# Patient Record
Sex: Male | Born: 1950
Health system: Southern US, Community
[De-identification: ages and names within clinical notes are randomized; demographics above are authoritative.]

## PROBLEM LIST (undated history)

## (undated) DIAGNOSIS — F329 Major depressive disorder, single episode, unspecified: Secondary | ICD-10-CM

## (undated) DIAGNOSIS — I10 Essential (primary) hypertension: Secondary | ICD-10-CM

## (undated) DIAGNOSIS — K219 Gastro-esophageal reflux disease without esophagitis: Secondary | ICD-10-CM

## (undated) DIAGNOSIS — G4730 Sleep apnea, unspecified: Secondary | ICD-10-CM

## (undated) DIAGNOSIS — F32A Depression, unspecified: Secondary | ICD-10-CM

## (undated) DIAGNOSIS — R7303 Prediabetes: Secondary | ICD-10-CM

## (undated) DIAGNOSIS — F419 Anxiety disorder, unspecified: Secondary | ICD-10-CM

## (undated) HISTORY — PX: VASECTOMY: SHX75

## (undated) HISTORY — PX: TONSILLECTOMY: SUR1361

## (undated) HISTORY — PX: HERNIA REPAIR: SHX51

---

## 2004-04-26 ENCOUNTER — Ambulatory Visit: Payer: Self-pay | Admitting: Internal Medicine

## 2005-10-01 ENCOUNTER — Ambulatory Visit: Payer: Self-pay | Admitting: Unknown Physician Specialty

## 2005-11-17 ENCOUNTER — Ambulatory Visit: Payer: Self-pay | Admitting: Unknown Physician Specialty

## 2005-11-17 LAB — HM COLONOSCOPY

## 2006-09-23 ENCOUNTER — Ambulatory Visit: Payer: Self-pay | Admitting: Unknown Physician Specialty

## 2006-10-05 ENCOUNTER — Ambulatory Visit: Payer: Self-pay | Admitting: Unknown Physician Specialty

## 2006-10-05 HISTORY — PX: UPPER GI ENDOSCOPY: SHX6162

## 2008-07-20 ENCOUNTER — Ambulatory Visit: Payer: Self-pay | Admitting: Family Medicine

## 2010-05-27 ENCOUNTER — Ambulatory Visit: Payer: Self-pay | Admitting: Surgery

## 2011-12-23 ENCOUNTER — Ambulatory Visit: Payer: Self-pay | Admitting: Family Medicine

## 2012-01-27 ENCOUNTER — Ambulatory Visit: Payer: Self-pay | Admitting: Family Medicine

## 2014-06-21 LAB — TSH: TSH: 3.98 u[IU]/mL (ref 0.41–5.90)

## 2014-06-21 LAB — CBC AND DIFFERENTIAL
HCT: 40 % — AB (ref 41–53)
HEMOGLOBIN: 13.9 g/dL (ref 13.5–17.5)
Neutrophils Absolute: 3 /uL
Platelets: 149 10*3/uL — AB (ref 150–399)
WBC: 4.8 10*3/mL

## 2014-06-21 LAB — HEPATIC FUNCTION PANEL
ALK PHOS: 90 U/L (ref 25–125)
ALT: 10 U/L (ref 10–40)
AST: 16 U/L (ref 14–40)
BILIRUBIN, TOTAL: 0.4 mg/dL

## 2014-06-21 LAB — BASIC METABOLIC PANEL
BUN: 15 mg/dL (ref 4–21)
CREATININE: 1.1 mg/dL (ref 0.6–1.3)
Glucose: 107 mg/dL
POTASSIUM: 5 mmol/L (ref 3.4–5.3)
Sodium: 138 mmol/L (ref 137–147)

## 2014-06-21 LAB — LIPID PANEL
Cholesterol: 215 mg/dL — AB (ref 0–200)
HDL: 40 mg/dL (ref 35–70)
LDL Cholesterol: 156 mg/dL
LDl/HDL Ratio: 3.9
TRIGLYCERIDES: 96 mg/dL (ref 40–160)

## 2014-06-21 LAB — PSA: PSA: 1

## 2015-06-14 DIAGNOSIS — E7800 Pure hypercholesterolemia, unspecified: Secondary | ICD-10-CM | POA: Insufficient documentation

## 2015-06-14 DIAGNOSIS — F411 Generalized anxiety disorder: Secondary | ICD-10-CM | POA: Insufficient documentation

## 2015-06-14 DIAGNOSIS — F32A Depression, unspecified: Secondary | ICD-10-CM | POA: Insufficient documentation

## 2015-06-14 DIAGNOSIS — C4491 Basal cell carcinoma of skin, unspecified: Secondary | ICD-10-CM | POA: Insufficient documentation

## 2015-06-14 DIAGNOSIS — F329 Major depressive disorder, single episode, unspecified: Secondary | ICD-10-CM | POA: Insufficient documentation

## 2015-06-14 DIAGNOSIS — M1990 Unspecified osteoarthritis, unspecified site: Secondary | ICD-10-CM | POA: Insufficient documentation

## 2015-06-14 DIAGNOSIS — J309 Allergic rhinitis, unspecified: Secondary | ICD-10-CM | POA: Insufficient documentation

## 2015-06-14 DIAGNOSIS — G4700 Insomnia, unspecified: Secondary | ICD-10-CM | POA: Insufficient documentation

## 2015-06-14 DIAGNOSIS — K5790 Diverticulosis of intestine, part unspecified, without perforation or abscess without bleeding: Secondary | ICD-10-CM | POA: Insufficient documentation

## 2015-06-14 DIAGNOSIS — K219 Gastro-esophageal reflux disease without esophagitis: Secondary | ICD-10-CM | POA: Insufficient documentation

## 2015-06-14 DIAGNOSIS — E559 Vitamin D deficiency, unspecified: Secondary | ICD-10-CM | POA: Insufficient documentation

## 2015-06-20 ENCOUNTER — Ambulatory Visit (INDEPENDENT_AMBULATORY_CARE_PROVIDER_SITE_OTHER): Payer: No Typology Code available for payment source | Admitting: Family Medicine

## 2015-06-20 ENCOUNTER — Encounter: Payer: Self-pay | Admitting: Family Medicine

## 2015-06-20 VITALS — BP 110/64 | HR 64 | Temp 97.8°F | Resp 16 | Ht 73.0 in | Wt 216.0 lb

## 2015-06-20 DIAGNOSIS — Z125 Encounter for screening for malignant neoplasm of prostate: Secondary | ICD-10-CM

## 2015-06-20 DIAGNOSIS — N411 Chronic prostatitis: Secondary | ICD-10-CM | POA: Diagnosis not present

## 2015-06-20 DIAGNOSIS — Z0000 Encounter for general adult medical examination without abnormal findings: Secondary | ICD-10-CM | POA: Diagnosis not present

## 2015-06-20 DIAGNOSIS — Z1211 Encounter for screening for malignant neoplasm of colon: Secondary | ICD-10-CM

## 2015-06-20 DIAGNOSIS — Z23 Encounter for immunization: Secondary | ICD-10-CM

## 2015-06-20 LAB — POCT URINALYSIS DIPSTICK
Bilirubin, UA: NEGATIVE
Blood, UA: NEGATIVE
GLUCOSE UA: NEGATIVE
KETONES UA: NEGATIVE
LEUKOCYTES UA: NEGATIVE
Nitrite, UA: NEGATIVE
PROTEIN UA: NEGATIVE
SPEC GRAV UA: 1.015
Urobilinogen, UA: 0.2
pH, UA: 6

## 2015-06-20 LAB — IFOBT (OCCULT BLOOD): IMMUNOLOGICAL FECAL OCCULT BLOOD TEST: NEGATIVE

## 2015-06-20 MED ORDER — SERTRALINE HCL 50 MG PO TABS: 50.0000 mg | ORAL_TABLET | Freq: Every day | ORAL | Status: DC

## 2015-06-20 MED ORDER — DOXYCYCLINE HYCLATE 100 MG PO TABS: 100.0000 mg | ORAL_TABLET | Freq: Two times a day (BID) | ORAL | Status: DC

## 2015-06-20 NOTE — Progress Notes (Signed)
Patient ID: [name], male   DOB: [birth year], 64 y.o.   MRN: [id number]       Patient: Steven Russell, Male    DOB: Oct 15, 1950, 64 y.o.   MRN: [id number] Visit Date: 06/20/2015  Today's Provider: Wilhemena Durie, MD   Chief Complaint  Patient presents with  . Annual Exam   Subjective:    Annual physical exam Steven Russell is a 64 y.o. male who presents today for health maintenance and complete physical. He feels well. He reports he is not exercising other than work. He reports he is sleeping well. Patient retired from work 2 years ago. He has a daughter,, sotalol, and 3 grandchildren that live in [place]. Overall he feels well. 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 ----------------------------------------------------------------- Colonoscopy- 11/17/05 internal hemorrhoids Tdap- 03/13/14 EKG- 06/21/14 Zoster- 12/07/12  Review of Systems  Constitutional: Negative.   HENT: Negative.   Eyes: Negative.   Respiratory: Negative.   Cardiovascular: Negative.   Gastrointestinal: Negative.   Endocrine: Negative.   Genitourinary: Positive for discharge.       For a few months patient has noticed that he has some mild chronic penile discharge of he and his wife have not been safely active for more than a week. It is thick and white color discharge. No other urinary symptoms. No risk factors for STD.  Musculoskeletal: Negative.   Skin: Negative.   Allergic/Immunologic: Negative.   Neurological: Negative.   Hematological: Negative.   Psychiatric/Behavioral: Negative.     Social History      He  reports that he has quit smoking. He does not have any smokeless tobacco history on file. He reports that he drinks alcohol. He reports that he does not use illicit drugs.       Social History   Social History  . Marital Status: Married    Spouse Name: N/A  . Number of Children: N/A  . Years of Education: N/A   Social History Main Topics  . Smoking status:  Former Research scientist (life sciences)  . Smokeless tobacco: None     Comment: quit 35 years ago  . Alcohol Use: Yes     Comment: occasionally, wine  . Drug Use: No  . Sexual Activity: Not Asked   Other Topics Concern  . None   Social History Narrative    Patient Active Problem List   Diagnosis Date Noted  . Allergic rhinitis 06/14/2015  . Basal cell carcinoma of skin 06/14/2015  . Clinical depression 06/14/2015  . DD (diverticular disease) 06/14/2015  . Anxiety, generalized 06/14/2015  . Acid reflux 06/14/2015  . Hypercholesteremia 06/14/2015  . Cannot sleep 06/14/2015  . Arthritis, degenerative 06/14/2015  . Avitaminosis D 06/14/2015    Past Surgical History  Procedure Laterality Date  . Hernia repair    . Vasectomy    . Tonsillectomy    . Upper gi endoscopy  10/05/06    normal duodenum, reflux esophagitis and hiatus hernia    Family History        Family Status  Relation Status Death Age  . Mother Deceased 78  . Father Deceased 43  . Sister Alive   . Brother Alive   . Daughter Alive   . Daughter Alive   . Sister Alive         His family history includes Brain cancer in his paternal grandfather; Breast cancer in his mother; Diabetes in his daughter and sister; Glaucoma in his mother; Lung cancer in  his father; Melanoma in his sister.    No Known Allergies  Previous Medications   ASPIRIN 81 MG TABLET    Take by mouth.   ESOMEPRAZOLE (NEXIUM) 40 MG CAPSULE    Take by mouth.   SERTRALINE (ZOLOFT) 50 MG TABLET    Take by mouth.    Patient Care Team: Jerrol Banana., MD as PCP - General (Family Medicine)     Objective:   Vitals: BP 110/64 mmHg  Pulse 64  Temp(Src) 97.8 F (36.6 C) (Oral)  Resp 16  Ht 6\' 1"  (1.854 m)  Wt 216 lb (97.977 kg)  BMI 28.50 kg/m2   Physical Exam  Constitutional: He is oriented to person, place, and time. He appears well-developed and well-nourished.  HENT:  Head: Normocephalic and atraumatic.  Right Ear: External ear normal.  Left  Ear: External ear normal.  Nose: Nose normal.  Mouth/Throat: Oropharynx is clear and moist.  Eyes: Conjunctivae and EOM are normal. Pupils are equal, round, and reactive to light.  Neck: Normal range of motion. Neck supple.  Cardiovascular: Normal rate, regular rhythm, normal heart sounds and intact distal pulses.   Pulmonary/Chest: Effort normal and breath sounds normal.  Abdominal: Soft. Bowel sounds are normal.  Genitourinary: Rectum normal, prostate normal and penis normal.  Musculoskeletal: Normal range of motion.  Neurological: He is alert and oriented to person, place, and time. He has normal reflexes.  Skin: Skin is warm and dry.  Psychiatric: He has a normal mood and affect. His behavior is normal. Judgment and thought content normal.     Depression Screen PHQ 2/9 Scores 06/20/2015  PHQ - 2 Score 0      Assessment & Plan:     Routine Health Maintenance and Physical Exam  Exercise Activities and Dietary recommendations Goals    None      Immunization History  Administered Date(s) Administered  . Td 03/25/2004  . Tdap 03/13/2014  . Zoster 12/07/2012    Health Maintenance  Topic Date Due  . Hepatitis C Screening  [birth year]  . HIV Screening  06/26/1967  . INFLUENZA VACCINE  02/12/2015  . COLONOSCOPY  11/18/2015  . TETANUS/TDAP  03/13/2024  . ZOSTAVAX  Completed      Discussed health benefits of physical activity, and encouraged him to engage in regular exercise appropriate for his age and condition.   I have done the exam and reviewed the above chart and it is accurate to the best of my knowledge. Chronic prostatitis We'll treat with doxycycline 100 mg twice a day for 2 weeks. May need further evaluation if it does not clear clinically. I have done the exam and reviewed the above chart and it is accurate to the best of my knowledge.  --------------------------------------------------------------------

## 2015-06-21 LAB — PSA: Prostate Specific Ag, Serum: 2 ng/mL (ref 0.0–4.0)

## 2015-06-21 LAB — COMPREHENSIVE METABOLIC PANEL
ALT: 15 IU/L (ref 0–44)
AST: 19 IU/L (ref 0–40)
Albumin/Globulin Ratio: 1.7 (ref 1.1–2.5)
Albumin: 4.5 g/dL (ref 3.6–4.8)
Alkaline Phosphatase: 99 IU/L (ref 39–117)
BUN/Creatinine Ratio: 15 (ref 10–22)
BUN: 16 mg/dL (ref 8–27)
Bilirubin Total: 0.5 mg/dL (ref 0.0–1.2)
CALCIUM: 9.5 mg/dL (ref 8.6–10.2)
CO2: 27 mmol/L (ref 18–29)
CREATININE: 1.09 mg/dL (ref 0.76–1.27)
Chloride: 98 mmol/L (ref 97–106)
GFR calc Af Amer: 84 mL/min/{1.73_m2} (ref >59)
GFR, EST NON AFRICAN AMERICAN: 72 mL/min/{1.73_m2} (ref >59)
GLOBULIN, TOTAL: 2.6 g/dL (ref 1.5–4.5)
Glucose: 113 mg/dL — ABNORMAL HIGH (ref 65–99)
Potassium: 4.4 mmol/L (ref 3.5–5.2)
SODIUM: 142 mmol/L (ref 136–144)
Total Protein: 7.1 g/dL (ref 6.0–8.5)

## 2015-06-21 LAB — CBC WITH DIFFERENTIAL/PLATELET
Basophils Absolute: 0 10*3/uL (ref 0.0–0.2)
Basos: 0 %
EOS (ABSOLUTE): 0 10*3/uL (ref 0.0–0.4)
EOS: 1 %
HEMATOCRIT: 41.7 % (ref 37.5–51.0)
HEMOGLOBIN: 14.1 g/dL (ref 12.6–17.7)
IMMATURE GRANS (ABS): 0 10*3/uL (ref 0.0–0.1)
IMMATURE GRANULOCYTES: 0 %
Lymphocytes Absolute: 1 10*3/uL (ref 0.7–3.1)
Lymphs: 21 %
MCH: 29 pg (ref 26.6–33.0)
MCHC: 33.8 g/dL (ref 31.5–35.7)
MCV: 86 fL (ref 79–97)
MONOCYTES: 11 %
Monocytes Absolute: 0.5 10*3/uL (ref 0.1–0.9)
NEUTROS PCT: 67 %
Neutrophils Absolute: 3.1 10*3/uL (ref 1.4–7.0)
PLATELETS: 159 10*3/uL (ref 150–379)
RBC: 4.87 x10E6/uL (ref 4.14–5.80)
RDW: 13.6 % (ref 12.3–15.4)
WBC: 4.6 10*3/uL (ref 3.4–10.8)

## 2015-06-21 LAB — TSH: TSH: 2.26 u[IU]/mL (ref 0.450–4.500)

## 2015-06-21 LAB — LIPID PANEL WITH LDL/HDL RATIO
Cholesterol, Total: 197 mg/dL (ref 100–199)
HDL: 37 mg/dL — AB (ref >39)
LDL Calculated: 136 mg/dL — ABNORMAL HIGH (ref 0–99)
LDl/HDL Ratio: 3.7 ratio units — ABNORMAL HIGH (ref 0.0–3.6)
Triglycerides: 120 mg/dL (ref 0–149)
VLDL Cholesterol Cal: 24 mg/dL (ref 5–40)

## 2015-06-22 ENCOUNTER — Other Ambulatory Visit: Payer: Self-pay | Admitting: Family Medicine

## 2015-07-11 ENCOUNTER — Encounter: Payer: Self-pay | Admitting: *Deleted

## 2015-07-12 ENCOUNTER — Ambulatory Visit: Payer: No Typology Code available for payment source | Admitting: Anesthesiology

## 2015-07-12 ENCOUNTER — Encounter: Payer: Self-pay | Admitting: *Deleted

## 2015-07-12 ENCOUNTER — Ambulatory Visit
Admission: RE | Admit: 2015-07-12 | Discharge: 2015-07-12 | Disposition: A | Discharge status: Home / Self Care | Payer: No Typology Code available for payment source | Source: Ambulatory Visit | Attending: Unknown Physician Specialty | Admitting: Unknown Physician Specialty

## 2015-07-12 ENCOUNTER — Encounter
Admission: RE | Disposition: A | Discharge status: Home / Self Care | Payer: Self-pay | Source: Ambulatory Visit | Attending: Unknown Physician Specialty

## 2015-07-12 DIAGNOSIS — K640 First degree hemorrhoids: Secondary | ICD-10-CM | POA: Insufficient documentation

## 2015-07-12 DIAGNOSIS — Z1211 Encounter for screening for malignant neoplasm of colon: Secondary | ICD-10-CM | POA: Diagnosis not present

## 2015-07-12 DIAGNOSIS — Z79899 Other long term (current) drug therapy: Secondary | ICD-10-CM | POA: Diagnosis not present

## 2015-07-12 DIAGNOSIS — Z87891 Personal history of nicotine dependence: Secondary | ICD-10-CM | POA: Insufficient documentation

## 2015-07-12 DIAGNOSIS — K5730 Diverticulosis of large intestine without perforation or abscess without bleeding: Secondary | ICD-10-CM | POA: Diagnosis not present

## 2015-07-12 DIAGNOSIS — Z7982 Long term (current) use of aspirin: Secondary | ICD-10-CM | POA: Insufficient documentation

## 2015-07-12 DIAGNOSIS — F329 Major depressive disorder, single episode, unspecified: Secondary | ICD-10-CM | POA: Insufficient documentation

## 2015-07-12 DIAGNOSIS — K219 Gastro-esophageal reflux disease without esophagitis: Secondary | ICD-10-CM | POA: Diagnosis not present

## 2015-07-12 HISTORY — DX: Major depressive disorder, single episode, unspecified: F32.9

## 2015-07-12 HISTORY — DX: Gastro-esophageal reflux disease without esophagitis: K21.9

## 2015-07-12 HISTORY — PX: COLONOSCOPY WITH PROPOFOL: SHX5780

## 2015-07-12 HISTORY — DX: Depression, unspecified: F32.A

## 2015-07-12 SURGERY — COLONOSCOPY WITH PROPOFOL
Anesthesia: General

## 2015-07-12 MED ORDER — FENTANYL CITRATE (PF) 100 MCG/2ML IJ SOLN
INTRAMUSCULAR | Status: DC | PRN
  Administered 2015-07-12: 50 ug via INTRAVENOUS

## 2015-07-12 MED ORDER — MIDAZOLAM HCL 2 MG/2ML IJ SOLN
INTRAMUSCULAR | Status: DC | PRN
  Administered 2015-07-12: 1 mg via INTRAVENOUS

## 2015-07-12 MED ORDER — EPHEDRINE SULFATE 50 MG/ML IJ SOLN
INTRAMUSCULAR | Status: DC | PRN
  Administered 2015-07-12: 5 mg via INTRAVENOUS

## 2015-07-12 MED ORDER — SODIUM CHLORIDE 0.9 % IV SOLN: INTRAVENOUS | Status: DC

## 2015-07-12 MED ORDER — SODIUM CHLORIDE 0.9 % IV SOLN
INTRAVENOUS | Status: DC
  Administered 2015-07-12: 1000 mL via INTRAVENOUS

## 2015-07-12 MED ORDER — PROPOFOL 500 MG/50ML IV EMUL
INTRAVENOUS | Status: DC | PRN
  Administered 2015-07-12: 120 ug/kg/min via INTRAVENOUS

## 2015-07-12 NOTE — Transfer of Care (Signed)
Immediate Anesthesia Transfer of Care Note  Patient: Steven Russell  Procedure(s) Performed: Procedure(s): COLONOSCOPY WITH PROPOFOL (N/A)  Patient Location: PACU  Anesthesia Type:General  Level of Consciousness: awake, alert  and sedated  Airway & Oxygen Therapy: Patient Spontanous Breathing and Patient connected to nasal cannula oxygen  Post-op Assessment: Report given to RN and Post -op Vital signs reviewed and stable  Post vital signs: Reviewed and stable  Last Vitals:  Filed Vitals:   07/12/15 1442  Pulse: 59  Temp: 36.2 C  Resp: 20    Complications: No apparent anesthesia complications

## 2015-07-12 NOTE — Op Note (Signed)
[facility] Gastroenterology Patient Name: Steven Russell Procedure Date: 07/12/2015 3:04 PM MRN: [id number] Account #: [id number] Date of Birth: Mar 29, 1951 Admit Type: Outpatient Age: 64 Room: [facility] ENDO ROOM 1 Gender: Male Note Status: Finalized Procedure:         Colonoscopy Indications:       Screening for colorectal malignant neoplasm Providers:         Manya Silvas, MD Referring MD:      Janine Ores. Rosanna Randy, MD (Referring MD) Medicines:         Propofol per Anesthesia Complications:     No immediate complications. Procedure:         Pre-Anesthesia Assessment:                    - After reviewing the risks and benefits, the patient was                     deemed in satisfactory condition to undergo the procedure.                    After obtaining informed consent, the colonoscope was                     passed under direct vision. Throughout the procedure, the                     patient's blood pressure, pulse, and oxygen saturations                     were monitored continuously. The Colonoscope was                     introduced through the anus and advanced to the the cecum,                     identified by appendiceal orifice and ileocecal valve. The                     colonoscopy was performed without difficulty. The patient                     tolerated the procedure well. The quality of the bowel                     preparation was good. Findings:      Multiple medium-mouthed diverticula were found in the sigmoid colon, in       the descending colon and in the transverse colon.      Internal hemorrhoids were found during endoscopy. The hemorrhoids were       small and Grade I (internal hemorrhoids that do not prolapse).      The exam was otherwise without abnormality. Impression:        - Diverticulosis in the sigmoid colon, in the descending                     colon and in the transverse colon.                    - Internal hemorrhoids.                  - The examination was otherwise normal.                    - No specimens collected.  Recommendation:    - The findings and recommendations were discussed with the                     patient's family. Manya Silvas, MD 07/12/2015 3:35:56 PM This report has been signed electronically. Number of Addenda: 0 Note Initiated On: 07/12/2015 3:04 PM Scope Withdrawal Time: 0 hours 10 minutes 52 seconds  Total Procedure Duration: 0 hours 16 minutes 49 seconds       [facility]

## 2015-07-12 NOTE — Anesthesia Procedure Notes (Signed)
Performed by: COOK-MARTIN, Sumi Lye Pre-anesthesia Checklist: Patient identified, Emergency Drugs available, Suction available, Patient being monitored and Timeout performed Patient Re-evaluated:Patient Re-evaluated prior to inductionOxygen Delivery Method: Nasal cannula Preoxygenation: Pre-oxygenation with 100% oxygen Intubation Type: IV induction Placement Confirmation: positive ETCO2 and CO2 detector       

## 2015-07-12 NOTE — Anesthesia Preprocedure Evaluation (Signed)
Anesthesia Evaluation  Patient identified by MRN, date of birth, ID band Patient awake    Reviewed: Allergy & Precautions, H&P , NPO status , Patient's Chart, lab work & pertinent test results  History of Anesthesia Complications Negative for: history of anesthetic complications  Airway Mallampati: III  TM Distance: >3 FB Neck ROM: limited    Dental no notable dental hx. (+) Teeth Intact   Pulmonary sleep apnea and Continuous Positive Airway Pressure Ventilation , former smoker,    Pulmonary exam normal breath sounds clear to auscultation       Cardiovascular Exercise Tolerance: Good (-) angina(-) Past MI and (-) DOE negative cardio ROS Normal cardiovascular exam Rhythm:regular Rate:Normal     Neuro/Psych PSYCHIATRIC DISORDERS Depression negative neurological ROS     GI/Hepatic Neg liver ROS, GERD  Controlled,  Endo/Other  negative endocrine ROS  Renal/GU negative Renal ROS  negative genitourinary   Musculoskeletal   Abdominal   Peds  Hematology negative hematology ROS (+)   Anesthesia Other Findings Past Medical History:   GERD (gastroesophageal reflux disease)                       Depression                                                  Past Surgical History:   HERNIA REPAIR                                                 VASECTOMY                                                     TONSILLECTOMY                                                 UPPER GI ENDOSCOPY                               10/05/06       Comment:normal duodenum, reflux esophagitis and hiatus               hernia  BMI    Body Mass Index   28.37 kg/m 2      Reproductive/Obstetrics negative OB ROS                             Anesthesia Physical Anesthesia Plan  ASA: III  Anesthesia Plan: General   Post-op Pain Management:    Induction:   Airway Management Planned:   Additional Equipment:    Intra-op Plan:   Post-operative Plan:   Informed Consent: I have reviewed the patients History and Physical, chart, labs and discussed the procedure including the risks, benefits and alternatives for the proposed anesthesia with the patient or authorized representative who has indicated his/her understanding  and acceptance.   Dental Advisory Given  Plan Discussed with: Anesthesiologist, CRNA and Surgeon  Anesthesia Plan Comments:         Anesthesia Quick Evaluation

## 2015-07-12 NOTE — H&P (Signed)
   Primary Care Physician:  Wilhemena Durie, MD Primary Gastroenterologist:  Dr. Vira Agar  Pre-Procedure History & Physical: HPI:  Steven Russell is a 64 y.o. male is here for an colonoscopy.   Past Medical History  Diagnosis Date  . GERD (gastroesophageal reflux disease)   . Depression     Past Surgical History  Procedure Laterality Date  . Hernia repair    . Vasectomy    . Tonsillectomy    . Upper gi endoscopy  10/05/06    normal duodenum, reflux esophagitis and hiatus hernia    Prior to Admission medications   Medication Sig Start Date End Date Taking? Authorizing Provider  aspirin 81 MG tablet Take by mouth. 05/26/11   Historical Provider, MD  doxycycline (VIBRA-TABS) 100 MG tablet Take 1 tablet (100 mg total) by mouth 2 (two) times daily. 06/20/15   Richard Maceo Pro., MD  esomeprazole (NEXIUM) 40 MG capsule Take by mouth. 06/15/13   Historical Provider, MD  sertraline (ZOLOFT) 50 MG tablet TAKE 1 1/2 TABLETS BY MOUTH EVERY DAY 06/22/15   Jerrol Banana., MD    Allergies as of 07/10/2015  . (No Known Allergies)    Family History  Problem Relation Age of Onset  . Breast cancer Mother   . Glaucoma Mother   . Lung cancer Father   . Diabetes Sister   . Diabetes Daughter   . Brain cancer Paternal Grandfather   . Melanoma Sister     Social History   Social History  . Marital Status: Married    Spouse Name: N/A  . Number of Children: N/A  . Years of Education: N/A   Occupational History  . Not on file.   Social History Main Topics  . Smoking status: Former Research scientist (life sciences)  . Smokeless tobacco: Not on file     Comment: quit 35 years ago  . Alcohol Use: Yes     Comment: occasionally, wine  . Drug Use: No  . Sexual Activity: Not on file   Other Topics Concern  . Not on file   Social History Narrative    Review of Systems: See HPI, otherwise negative ROS  Physical Exam: BP 133/79 mmHg  Pulse 51  Temp(Src) 97 F (36.1 C) (Tympanic)  Resp 17  Ht  6\' 1"  (1.854 m)  Wt 97.523 kg (215 lb)  BMI 28.37 kg/m2  SpO2 97% General:   Alert,  pleasant and cooperative in NAD Head:  Normocephalic and atraumatic. Neck:  Supple; no masses or thyromegaly. Lungs:  Clear throughout to auscultation.    Heart:  Regular rate and rhythm. Abdomen:  Soft, nontender and nondistended. Normal bowel sounds, without guarding, and without rebound.   Neurologic:  Alert and  oriented x4;  grossly normal neurologically.  Impression/Plan: [name] is here for an colonoscopy to be performed for screening  Risks, benefits, limitations, and alternatives regarding  colonoscopy have been reviewed with the patient.  Questions have been answered.  All parties agreeable.   Gaylyn Cheers, MD  07/12/2015, 6:55 PM

## 2015-07-12 NOTE — Anesthesia Postprocedure Evaluation (Signed)
Anesthesia Post Note  Patient: Steven Russell  Procedure(s) Performed: Procedure(s) (LRB): COLONOSCOPY WITH PROPOFOL (N/A)  Patient location during evaluation: Endoscopy Anesthesia Type: General Level of consciousness: awake Pain management: pain level controlled Vital Signs Assessment: post-procedure vital signs reviewed and stable Respiratory status: spontaneous breathing Cardiovascular status: blood pressure returned to baseline Anesthetic complications: no    Last Vitals:  Filed Vitals:   07/12/15 1559 07/12/15 1609  BP: 114/78 133/79  Pulse: 51 51  Temp:    Resp: 13 17    Last Pain: There were no vitals filed for this visit.               Heidie Krall S

## 2015-07-17 ENCOUNTER — Encounter: Payer: Self-pay | Admitting: Unknown Physician Specialty

## 2015-11-01 ENCOUNTER — Encounter: Payer: Self-pay | Admitting: Family Medicine

## 2015-11-01 ENCOUNTER — Ambulatory Visit (INDEPENDENT_AMBULATORY_CARE_PROVIDER_SITE_OTHER): Payer: Self-pay | Admitting: Family Medicine

## 2015-11-01 VITALS — BP 128/78 | HR 68 | Temp 98.9°F | Resp 14 | Wt 220.0 lb

## 2015-11-01 DIAGNOSIS — R202 Paresthesia of skin: Secondary | ICD-10-CM

## 2015-11-01 NOTE — Patient Instructions (Addendum)
Discussed taking two Aleve twice daily with food and f/u with your chiropractor. If getting worse, follow up with Dr. Rosanna Randy for further evaluation.

## 2015-11-01 NOTE — Progress Notes (Signed)
Subjective:     Patient ID: Steven Russell, male   DOB: [birth year], 65 y.o.   MRN: [id number]  HPI  Chief Complaint  Patient presents with  . Numbness    Patient reports left leg and foot numbness. Numbness started yesterday. Patient states he also has a throbbing sensation in upper thigh area.   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 Reports chronic low back pain and get periodic adjustments from his chiropractor, Dr. Olen Cordial. States his back was adjusted this AM. No recent back imaging reported. Does to wear a tool belt while working.   Review of Systems     Objective:   Physical Exam  Constitutional: He appears well-developed and well-nourished. No distress.  Cardiovascular:  Pulses:      Dorsalis pedis pulses are 2+ on the left side.       Posterior tibial pulses are 2+ on the left side.  Skin warm without pedal edema  Musculoskeletal:  Muscle strength in lower extremities 5/5. SLR's to 90 degrees without radiation of back pain.  Neurological:  Sensation to PP intact in distal left lower extremity  Skin:  No rash noted       Assessment:    1. Left leg paresthesias     Plan:    Discussed use of nsaid;s and f/u with his chiropractor. Will f/u with his primary M.D., Dr. Rosanna Randy, if symptoms worsening.

## 2016-06-10 ENCOUNTER — Other Ambulatory Visit: Payer: Self-pay | Admitting: Family Medicine

## 2016-06-23 ENCOUNTER — Ambulatory Visit (INDEPENDENT_AMBULATORY_CARE_PROVIDER_SITE_OTHER): Payer: Self-pay | Admitting: Family Medicine

## 2016-06-23 ENCOUNTER — Other Ambulatory Visit: Payer: Self-pay | Admitting: Family Medicine

## 2016-06-23 ENCOUNTER — Encounter: Payer: Self-pay | Admitting: Family Medicine

## 2016-06-23 VITALS — BP 132/90 | HR 72 | Temp 98.5°F | Resp 14 | Ht 72.5 in | Wt 227.0 lb

## 2016-06-23 DIAGNOSIS — Z0000 Encounter for general adult medical examination without abnormal findings: Secondary | ICD-10-CM

## 2016-06-23 DIAGNOSIS — L570 Actinic keratosis: Secondary | ICD-10-CM

## 2016-06-23 DIAGNOSIS — K219 Gastro-esophageal reflux disease without esophagitis: Secondary | ICD-10-CM

## 2016-06-23 DIAGNOSIS — R739 Hyperglycemia, unspecified: Secondary | ICD-10-CM

## 2016-06-23 DIAGNOSIS — Z23 Encounter for immunization: Secondary | ICD-10-CM

## 2016-06-23 DIAGNOSIS — E7800 Pure hypercholesterolemia, unspecified: Secondary | ICD-10-CM

## 2016-06-23 DIAGNOSIS — Z1211 Encounter for screening for malignant neoplasm of colon: Secondary | ICD-10-CM

## 2016-06-23 DIAGNOSIS — F411 Generalized anxiety disorder: Secondary | ICD-10-CM

## 2016-06-23 LAB — IFOBT (OCCULT BLOOD): IFOBT: NEGATIVE

## 2016-06-23 MED ORDER — SERTRALINE HCL 50 MG PO TABS: ORAL_TABLET | ORAL | 3 refills | Status: DC

## 2016-06-23 MED ORDER — PROBIOTIC 250 MG PO CAPS
1.0000 | ORAL_CAPSULE | Freq: Every day | ORAL | 0 refills | Status: DC
Start: 2016-06-23 — End: 2017-07-23

## 2016-06-23 MED ORDER — RANITIDINE HCL 150 MG PO TABS: 150.0000 mg | ORAL_TABLET | Freq: Every day | ORAL | 0 refills | Status: DC

## 2016-06-23 NOTE — Patient Instructions (Signed)
Start Zantac 1 tablet in the evening, over the counter. Then in January add probiotic over the counter

## 2016-06-23 NOTE — Progress Notes (Signed)
Patient: Steven Russell, Male    DOB: April 10, 1951, 65 y.o.   MRN: [id number] Visit Date: 06/23/2016  Today's Provider: Wilhemena Durie, MD   Chief Complaint  Patient presents with  . Annual Exam   Subjective:  Steven Russell is a 65 y.o. male who presents today for health maintenance and complete physical. He feels fairly well. 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 He reports he is sleeping well. Immunization History  Administered Date(s) Administered  . Influenza,inj,Quad PF,36+ Mos 06/20/2015  . Td 03/25/2004  . Tdap 03/13/2014  . Zoster 12/07/2012   Last colonoscopy 07/12/15 diverticulosis, internal hemorrhoids, otherwise normal.  Has noticed some blurred vision at times when he is trying to read something. Does not happen every time. No other symptoms present at that time.  Has also noticed more bloaty feeling. He is taking nexium daily.  Review of Systems  Constitutional: Negative.   HENT: Negative.   Eyes: Positive for visual disturbance.  Respiratory: Positive for apnea.   Gastrointestinal: Positive for abdominal distention.  Endocrine: Negative.   Genitourinary: Negative.   Musculoskeletal: Negative.   Skin: Negative.   Allergic/Immunologic: Negative.   Neurological: Negative.   Hematological: Negative.   Psychiatric/Behavioral: Negative.     Social History   Social History  . Marital status: Married    Spouse name: N/A  . Number of children: N/A  . Years of education: N/A   Occupational History  . Not on file.   Social History Main Topics  . Smoking status: Former Research scientist (life sciences)  . Smokeless tobacco: Never Used     Comment: quit 35 years ago  . Alcohol use Yes     Comment: wine or beer a glass a day usually  . Drug use: No  . Sexual activity: Not on file   Other Topics Concern  . Not on file   Social History Narrative  . No narrative on file    Patient Active Problem List   Diagnosis Date Noted  . Allergic  rhinitis 06/14/2015  . Basal cell carcinoma of skin 06/14/2015  . Clinical depression 06/14/2015  . DD (diverticular disease) 06/14/2015  . Anxiety, generalized 06/14/2015  . Acid reflux 06/14/2015  . Hypercholesteremia 06/14/2015  . Cannot sleep 06/14/2015  . Arthritis, degenerative 06/14/2015  . Avitaminosis D 06/14/2015    Past Surgical History:  Procedure Laterality Date  . COLONOSCOPY WITH PROPOFOL N/A 07/12/2015   Procedure: COLONOSCOPY WITH PROPOFOL;  Surgeon: Manya Silvas, MD;  Location: [facility] ENDOSCOPY;  Service: Endoscopy;  Laterality: N/A;  . HERNIA REPAIR    . TONSILLECTOMY    . UPPER GI ENDOSCOPY  10/05/06   normal duodenum, reflux esophagitis and hiatus hernia  . VASECTOMY      His family history includes Brain cancer in his paternal grandfather; Breast cancer in his mother; Diabetes in his daughter and sister; Glaucoma in his mother; Lung cancer in his father; Melanoma in his sister.     Outpatient Encounter Prescriptions as of 06/23/2016  Medication Sig Note  . aspirin 81 MG tablet Take by mouth. 06/14/2015: Received from: [facility]  . esomeprazole (NEXIUM) 40 MG capsule Take by mouth. 06/14/2015: Received from: [facility]  . sertraline (ZOLOFT) 50 MG tablet TAKE 1 1/2 TABLETS BY MOUTH EVERY DAY    No facility-administered encounter medications on file as of 06/23/2016.     Patient Care Team: Jerrol Banana., MD as PCP - General (Family Medicine)  Objective:   Vitals:  Vitals:   06/23/16 0910  BP: 132/90  Pulse: 72  Resp: 14  Temp: 98.5 F (36.9 C)  Weight: 227 lb (103 kg)  Height: 6' 0.5" (1.842 m)    Physical Exam  Constitutional: He is oriented to person, place, and time. He appears well-developed and well-nourished.  HENT:  Head: Normocephalic and atraumatic.  Right Ear: External ear normal.  Left Ear: External ear normal.  Eyes: Conjunctivae are normal. Pupils are equal, round, and  reactive to light.  Neck: Normal range of motion. Neck supple.  Cardiovascular: Normal rate, regular rhythm, normal heart sounds and intact distal pulses.   No murmur heard. Pulmonary/Chest: Effort normal and breath sounds normal. No respiratory distress. He has no wheezes.  Abdominal: Soft. Bowel sounds are normal. He exhibits no distension. There is no tenderness.  Genitourinary: Rectum normal, prostate normal and penis normal. No penile tenderness.  Musculoskeletal: He exhibits no edema or tenderness.  Neurological: He is alert and oriented to person, place, and time. No cranial nerve deficit. He exhibits normal muscle tone. Coordination normal.  Skin: No rash noted. No erythema.  Fair skin.  Psychiatric: He has a normal mood and affect. His behavior is normal. Judgment and thought content normal.     Depression Screen PHQ 2/9 Scores 06/20/2015  PHQ - 2 Score 0    Assessment & Plan:   1. Annual physical exam - CBC w/Diff/Platelet - Lipid Panel With LDL/HDL Ratio - TSH - Comprehensive metabolic panel  2. Colon cancer screening 3. Need for influenza vaccination - Flu Vaccine QUAD 36+ mos PF IM (Fluarix & Fluzone Quad PF)  4. Hypercholesteremia  5. Hyperglycemia - HgB A1c  6. AK (actinic keratosis) - Ambulatory referral to Dermatology  7. Gastroesophageal reflux disease, esophagitis presence not specified Try OTC Zantac and probiotic. Will treat symptoms as GERD. - ranitidine (ZANTAC) 150 MG tablet; Take 1 tablet (150 mg total) by mouth at bedtime.  Dispense: 30 tablet; Refill: 0 - Saccharomyces boulardii (PROBIOTIC) 250 MG CAPS; Take 1 capsule by mouth daily.  Dispense: 30 capsule; Refill: 0  8. Anxiety, generalized Controlled with med. Refill given. - sertraline (ZOLOFT) 50 MG tablet; TAKE 1 TABLETS BY MOUTH EVERY DAY  Dispense: 90 tablet; Refill: 3  HPI, Exam and A&P transcribed under direction and in the presence of Miguel Aschoff, MD. I have done the exam and  reviewed the chart and it is accurate to the best of my knowledge. Development worker, community has been used and  any errors in dictation or transcription are unintentional. Miguel Aschoff M.D. [facility]

## 2016-06-24 ENCOUNTER — Telehealth: Payer: Self-pay

## 2016-06-24 LAB — COMPREHENSIVE METABOLIC PANEL
ALK PHOS: 88 IU/L (ref 39–117)
ALT: 13 IU/L (ref 0–44)
AST: 16 IU/L (ref 0–40)
Albumin/Globulin Ratio: 1.7 (ref 1.2–2.2)
Albumin: 4.3 g/dL (ref 3.6–4.8)
BUN/Creatinine Ratio: 15 (ref 10–24)
BUN: 16 mg/dL (ref 8–27)
Bilirubin Total: 0.5 mg/dL (ref 0.0–1.2)
CO2: 24 mmol/L (ref 18–29)
CREATININE: 1.07 mg/dL (ref 0.76–1.27)
Calcium: 9.5 mg/dL (ref 8.6–10.2)
Chloride: 102 mmol/L (ref 96–106)
GFR calc Af Amer: 85 mL/min/{1.73_m2} (ref >59)
GFR calc non Af Amer: 73 mL/min/{1.73_m2} (ref >59)
GLOBULIN, TOTAL: 2.6 g/dL (ref 1.5–4.5)
GLUCOSE: 105 mg/dL — AB (ref 65–99)
Potassium: 4.4 mmol/L (ref 3.5–5.2)
SODIUM: 142 mmol/L (ref 134–144)
Total Protein: 6.9 g/dL (ref 6.0–8.5)

## 2016-06-24 LAB — CBC WITH DIFFERENTIAL/PLATELET
BASOS ABS: 0 10*3/uL (ref 0.0–0.2)
Basos: 0 %
EOS (ABSOLUTE): 0.1 10*3/uL (ref 0.0–0.4)
Eos: 1 %
Hematocrit: 38.7 % (ref 37.5–51.0)
Hemoglobin: 13.1 g/dL (ref 13.0–17.7)
Immature Grans (Abs): 0 10*3/uL (ref 0.0–0.1)
Immature Granulocytes: 1 %
LYMPHS ABS: 1.2 10*3/uL (ref 0.7–3.1)
Lymphs: 24 %
MCH: 29.4 pg (ref 26.6–33.0)
MCHC: 33.9 g/dL (ref 31.5–35.7)
MCV: 87 fL (ref 79–97)
MONOS ABS: 0.5 10*3/uL (ref 0.1–0.9)
Monocytes: 11 %
Neutrophils Absolute: 3.2 10*3/uL (ref 1.4–7.0)
Neutrophils: 63 %
PLATELETS: 147 10*3/uL — AB (ref 150–379)
RBC: 4.46 x10E6/uL (ref 4.14–5.80)
RDW: 13.5 % (ref 12.3–15.4)
WBC: 5 10*3/uL (ref 3.4–10.8)

## 2016-06-24 LAB — LIPID PANEL WITH LDL/HDL RATIO
CHOLESTEROL TOTAL: 207 mg/dL — AB (ref 100–199)
HDL: 39 mg/dL — ABNORMAL LOW (ref >39)
LDL Calculated: 140 mg/dL — ABNORMAL HIGH (ref 0–99)
LDl/HDL Ratio: 3.6 ratio units (ref 0.0–3.6)
TRIGLYCERIDES: 140 mg/dL (ref 0–149)
VLDL Cholesterol Cal: 28 mg/dL (ref 5–40)

## 2016-06-24 LAB — HEMOGLOBIN A1C
ESTIMATED AVERAGE GLUCOSE: 114 mg/dL
Hgb A1c MFr Bld: 5.6 % (ref 4.8–5.6)

## 2016-06-24 LAB — TSH: TSH: 3.28 u[IU]/mL (ref 0.450–4.500)

## 2016-06-24 NOTE — Telephone Encounter (Signed)
Advised pt of lab results. Pt verbally acknowledges understanding. Smita Lesh Drozdowski, CMA   

## 2016-06-24 NOTE — Telephone Encounter (Signed)
-----   Message from Jerrol Banana., MD sent at 06/24/2016  8:20 AM EST ----- Labs stable. Prediabetes better. Continue to work on diet and exercise.

## 2016-07-04 ENCOUNTER — Ambulatory Visit (INDEPENDENT_AMBULATORY_CARE_PROVIDER_SITE_OTHER): Payer: Self-pay | Admitting: Physician Assistant

## 2016-07-04 ENCOUNTER — Telehealth: Payer: Self-pay | Admitting: Family Medicine

## 2016-07-04 DIAGNOSIS — Z298 Encounter for other specified prophylactic measures: Secondary | ICD-10-CM

## 2016-07-04 DIAGNOSIS — Z23 Encounter for immunization: Secondary | ICD-10-CM

## 2016-07-04 DIAGNOSIS — Z2989 Encounter for other specified prophylactic measures: Secondary | ICD-10-CM

## 2016-07-04 MED ORDER — ATOVAQUONE-PROGUANIL HCL 250-100 MG PO TABS: 1.0000 | ORAL_TABLET | Freq: Every day | ORAL | 0 refills | Status: AC

## 2016-07-04 MED ORDER — TYPHOID VACCINE PO CPDR: 1.0000 | DELAYED_RELEASE_CAPSULE | ORAL | 0 refills | Status: DC

## 2016-07-04 NOTE — Progress Notes (Signed)
Pt comes in today for a Hep A vaccine

## 2016-07-04 NOTE — Telephone Encounter (Signed)
"  Initial dosage: 1 capsule containing 2 to 10 x 10(9) live Salmonella typhi Ty21a bacteria orally 1 hour before a meal with a cold or luke-warm drink (not to exceed body temperature) on days 1, 3, 5, and 7, for total of 4 doses; complete at least 1 week prior to potential exposure."   Sent in to [facility] on [address]. There is a small precaution of this drug when given with proguanil, but not a contraindication. Discussed with Dr. Rosanna Randy and without ability to get IM vaccination, this is patient's best option. Should be completed one week before potential exposure.

## 2016-07-04 NOTE — Telephone Encounter (Signed)
Patient's BUN/Cr from 06/23/2016 normal, no hx of hypersensitivity. Patient is traveling for seven days to [place]. Take pills 1-2 days before leaving, every day while there, and for 7 days after for total of 16 days.

## 2016-07-04 NOTE — Telephone Encounter (Signed)
Pt is calling to give the name of the Rx to go out of the country.  Pt staes the Rx should be for Malarone.  Pt is requesting a 16 day supply.  [place]  CB#[phone]/MW

## 2016-07-04 NOTE — Telephone Encounter (Signed)
Pt advised.   Thanks,   -Aquinnah Devin  

## 2016-07-04 NOTE — Telephone Encounter (Signed)
Pt stated that he saw Adriana this morning and since we didn't have the vaccine for typhoid fever he thought he could go to [place] to get it. [facility] advised him that our office would have to send in an Rx and they recommend Vivotif pill form that he would start taking prior to going on trip to [place]. Pt is requesting a Rx to be sent to [place] for Vivotif. Please advise. Thanks TNP

## 2017-06-25 DIAGNOSIS — R69 Illness, unspecified: Secondary | ICD-10-CM | POA: Diagnosis not present

## 2017-06-29 ENCOUNTER — Encounter: Payer: Self-pay | Admitting: Family Medicine

## 2017-07-17 DIAGNOSIS — D2261 Melanocytic nevi of right upper limb, including shoulder: Secondary | ICD-10-CM | POA: Diagnosis not present

## 2017-07-17 DIAGNOSIS — L570 Actinic keratosis: Secondary | ICD-10-CM | POA: Diagnosis not present

## 2017-07-17 DIAGNOSIS — Z85828 Personal history of other malignant neoplasm of skin: Secondary | ICD-10-CM | POA: Diagnosis not present

## 2017-07-17 DIAGNOSIS — D225 Melanocytic nevi of trunk: Secondary | ICD-10-CM | POA: Diagnosis not present

## 2017-07-17 DIAGNOSIS — D2272 Melanocytic nevi of left lower limb, including hip: Secondary | ICD-10-CM | POA: Diagnosis not present

## 2017-07-17 DIAGNOSIS — X32XXXA Exposure to sunlight, initial encounter: Secondary | ICD-10-CM | POA: Diagnosis not present

## 2017-07-23 ENCOUNTER — Other Ambulatory Visit: Payer: Self-pay

## 2017-07-23 ENCOUNTER — Encounter: Payer: Self-pay | Admitting: Family Medicine

## 2017-07-23 ENCOUNTER — Ambulatory Visit (INDEPENDENT_AMBULATORY_CARE_PROVIDER_SITE_OTHER): Payer: Medicare HMO | Admitting: Family Medicine

## 2017-07-23 VITALS — BP 128/70 | HR 50 | Temp 98.0°F | Resp 14 | Ht 73.0 in | Wt 228.0 lb

## 2017-07-23 DIAGNOSIS — Z1211 Encounter for screening for malignant neoplasm of colon: Secondary | ICD-10-CM

## 2017-07-23 DIAGNOSIS — Z23 Encounter for immunization: Secondary | ICD-10-CM

## 2017-07-23 DIAGNOSIS — Z0000 Encounter for general adult medical examination without abnormal findings: Secondary | ICD-10-CM | POA: Diagnosis not present

## 2017-07-23 DIAGNOSIS — R69 Illness, unspecified: Secondary | ICD-10-CM | POA: Diagnosis not present

## 2017-07-23 DIAGNOSIS — F411 Generalized anxiety disorder: Secondary | ICD-10-CM | POA: Diagnosis not present

## 2017-07-23 LAB — IFOBT (OCCULT BLOOD): IFOBT: NEGATIVE

## 2017-07-23 MED ORDER — SERTRALINE HCL 50 MG PO TABS: ORAL_TABLET | ORAL | 3 refills | Status: DC

## 2017-07-23 NOTE — Progress Notes (Signed)
Patient: Steven Russell, Male    DOB: 1951-07-05, 67 y.o.   MRN: [id number] Visit Date: 07/23/2017  Today's Provider: Wilhemena Durie, MD   Chief Complaint  Patient presents with  . Medicare Wellness   Subjective:   Initial preventative physical exam Steven Russell is a 67 y.o. male who presents today for his Initial Preventative Physical Exam. He feels well. He reports exercising formally, but has an active job. He reports he is sleeping well. He is expecting 5th grandchild in 60 month.  Colonoscopy- 07/12/15 Vira Agar, Diverticulosis, internal hemorrhoids, otherwise normal.   Review of Systems  Constitutional: Negative.   HENT: Negative.   Eyes: Negative.   Respiratory: Negative.   Cardiovascular: Negative.   Gastrointestinal: Positive for abdominal distention (occasionally).  Endocrine: Negative.   Genitourinary: Negative.   Musculoskeletal: Negative.   Skin: Negative.   Allergic/Immunologic: Negative.   Neurological: Negative.   Hematological: Negative.   Psychiatric/Behavioral: Negative.     Social History   Socioeconomic History  . Marital status: Married    Spouse name: Not on file  . Number of children: Not on file  . Years of education: Not on file  . Highest education level: Not on file  Social Needs  . Financial resource strain: Not on file  . Food insecurity - worry: Not on file  . Food insecurity - inability: Not on file  . Transportation needs - medical: Not on file  . Transportation needs - non-medical: Not on file  Occupational History  . Not on file  Tobacco Use  . Smoking status: Former Research scientist (life sciences)  . Smokeless tobacco: Never Used  . Tobacco comment: quit 35 years ago  Substance and Sexual Activity  . Alcohol use: Yes    Comment: wine or beer a glass a day usually  . Drug use: No  . Sexual activity: Not on file  Other Topics Concern  . Not on file  Social History Narrative  . Not on file    Past Medical History:  Diagnosis Date   . Depression   . GERD (gastroesophageal reflux disease)      Patient Active Problem List   Diagnosis Date Noted  . Allergic rhinitis 06/14/2015  . Basal cell carcinoma of skin 06/14/2015  . Clinical depression 06/14/2015  . DD (diverticular disease) 06/14/2015  . Anxiety, generalized 06/14/2015  . Acid reflux 06/14/2015  . Hypercholesteremia 06/14/2015  . Cannot sleep 06/14/2015  . Arthritis, degenerative 06/14/2015  . Avitaminosis D 06/14/2015    Past Surgical History:  Procedure Laterality Date  . COLONOSCOPY WITH PROPOFOL N/A 07/12/2015   Procedure: COLONOSCOPY WITH PROPOFOL;  Surgeon: Manya Silvas, MD;  Location: [facility] ENDOSCOPY;  Service: Endoscopy;  Laterality: N/A;  . HERNIA REPAIR    . TONSILLECTOMY    . UPPER GI ENDOSCOPY  10/05/06   normal duodenum, reflux esophagitis and hiatus hernia  . VASECTOMY      His family history includes Brain cancer in his paternal grandfather; Breast cancer in his mother; Diabetes in his daughter and sister; Glaucoma in his mother; Lung cancer in his father; Melanoma in his sister.      Current Outpatient Medications:  .  aspirin 81 MG tablet, Take by mouth., Disp: , Rfl:  .  esomeprazole (NEXIUM) 40 MG capsule, Take by mouth., Disp: , Rfl:  .  ranitidine (ZANTAC) 150 MG tablet, Take 1 tablet (150 mg total) by mouth at bedtime., Disp: 30 tablet, Rfl: 0 .  sertraline (  ZOLOFT) 50 MG tablet, TAKE 1 TABLETS BY MOUTH EVERY DAY, Disp: 90 tablet, Rfl: 3   Patient Care Team: Jerrol Banana., MD as PCP - General (Family Medicine)      Objective:   Vitals: BP 128/70 (BP Location: Left Arm, Patient Position: Sitting, Cuff Size: Large)   Pulse (!) 50   Temp 98 F (36.7 C) (Oral)   Resp 14   Ht 6\' 1"  (1.854 m)   Wt 228 lb (103.4 kg)   BMI 30.08 kg/m   Physical Exam  Constitutional: He is oriented to person, place, and time. He appears well-developed and well-nourished.  HENT:  Head: Normocephalic and atraumatic.  Right  Ear: External ear normal.  Left Ear: External ear normal.  Nose: Nose normal.  Mouth/Throat: Oropharynx is clear and moist.  Eyes: Conjunctivae are normal. No scleral icterus.  Neck: No thyromegaly present.  Cardiovascular: Normal rate, regular rhythm, normal heart sounds and intact distal pulses.  Pulmonary/Chest: Effort normal and breath sounds normal.  Abdominal: Soft.  Genitourinary: Rectum normal, prostate normal and penis normal.  Musculoskeletal: Normal range of motion.  Lymphadenopathy:    He has no cervical adenopathy.  Neurological: He is alert and oriented to person, place, and time.  Skin: Skin is warm and dry.  Psychiatric: He has a normal mood and affect. His behavior is normal. Judgment and thought content normal.     No exam data present  Activities of Daily Living In your present state of health, do you have any difficulty performing the following activities: 07/23/2017  Hearing? N  Vision? N  Difficulty concentrating or making decisions? N  Walking or climbing stairs? N  Dressing or bathing? N  Doing errands, shopping? N  Some recent data might be hidden    Fall Risk Assessment Fall Risk  07/23/2017 06/20/2015  Falls in the past year? No No     Depression Screen PHQ 2/9 Scores 07/23/2017 07/23/2017 06/20/2015  PHQ - 2 Score 0 0 0  PHQ- 9 Score 2 - -    Cognitive Testing - 6-CIT  Correct? Score   What year is it? yes 0 0 or 4  What month is it? yes 0 0 or 3  Memorize:    Pia Mau,  43,  [address]  [place],      What time is it? (within 1 hour) yes 0 0 or 3  Count backwards from 20 yes 0 0, 2, or 4  Name the months of the year yes 0 0, 2, or 4  Repeat name & address above yes 0 0, 2, 4, 6, 8, or 10       TOTAL SCORE  0/28   Interpretation:  Normal  Normal (0-7) Abnormal (8-28)      Assessment & Plan:     Initial Preventative Physical Exam  Reviewed patient's Family Medical History Reviewed and updated list of patient's medical  providers Assessment of cognitive impairment was done Assessed patient's functional ability Established a written schedule for health screening [place] Completed and Reviewed  Exercise Activities and Dietary recommendations Goals    None      Immunization History  Administered Date(s) Administered  . Hepatitis A, Adult 07/04/2016  . Influenza,inj,Quad PF,6+ Mos 06/20/2015, 06/23/2016  . Td 03/25/2004  . Tdap 03/13/2014  . Zoster 12/07/2012    Health Maintenance  Topic Date Due  . HIV Screening  06/26/1967  . PNA vac Low Risk Adult (1 of 2 - PCV13)  06/25/2017  . INFLUENZA VACCINE  02/11/2018 (Originally 02/11/2017)  . TETANUS/TDAP  03/13/2024  . COLONOSCOPY  07/11/2025  . Hepatitis C Screening  Completed     Discussed health benefits of physical activity, and encouraged him to engage in regular exercise appropriate for his age and condition.    ------------------------------------------------------------------------------------------------------------    Wilhemena Durie, MD  [place]

## 2017-08-11 DIAGNOSIS — R69 Illness, unspecified: Secondary | ICD-10-CM | POA: Diagnosis not present

## 2017-09-21 ENCOUNTER — Encounter: Payer: Self-pay | Admitting: Family Medicine

## 2017-09-21 ENCOUNTER — Ambulatory Visit (INDEPENDENT_AMBULATORY_CARE_PROVIDER_SITE_OTHER): Payer: Medicare HMO | Admitting: Family Medicine

## 2017-09-21 VITALS — BP 138/72 | HR 52 | Temp 98.4°F | Resp 16

## 2017-09-21 DIAGNOSIS — K219 Gastro-esophageal reflux disease without esophagitis: Secondary | ICD-10-CM

## 2017-09-21 DIAGNOSIS — R69 Illness, unspecified: Secondary | ICD-10-CM | POA: Diagnosis not present

## 2017-09-21 DIAGNOSIS — F32A Depression, unspecified: Secondary | ICD-10-CM

## 2017-09-21 DIAGNOSIS — E7800 Pure hypercholesterolemia, unspecified: Secondary | ICD-10-CM

## 2017-09-21 DIAGNOSIS — F411 Generalized anxiety disorder: Secondary | ICD-10-CM | POA: Diagnosis not present

## 2017-09-21 DIAGNOSIS — F329 Major depressive disorder, single episode, unspecified: Secondary | ICD-10-CM | POA: Diagnosis not present

## 2017-09-21 NOTE — Progress Notes (Signed)
       Patient: Steven Russell Male    DOB: 05/18/1951   67 y.o.   MRN: [id number] Visit Date: 09/21/2017  Today's Provider: Wilhemena Durie, MD   Chief Complaint  Patient presents with  . Depression  . Gastroesophageal Reflux   Subjective:    HPI Pt is here today to follow up from his CPE. He is here for labs. Pt reports that he is feeling well. No problems, symptoms or concerns to discuss today.      No Known Allergies   Current Outpatient Medications:  .  aspirin 81 MG tablet, Take by mouth., Disp: , Rfl:  .  esomeprazole (NEXIUM) 40 MG capsule, Take by mouth., Disp: , Rfl:  .  ranitidine (ZANTAC) 150 MG tablet, Take 1 tablet (150 mg total) by mouth at bedtime., Disp: 30 tablet, Rfl: 0 .  sertraline (ZOLOFT) 50 MG tablet, TAKE 1 TABLETS BY MOUTH EVERY DAY, Disp: 90 tablet, Rfl: 3  Review of Systems  Constitutional: Negative.   HENT: Negative.   Eyes: Negative.   Respiratory: Negative.   Cardiovascular: Negative.   Gastrointestinal: Negative.   Endocrine: Negative.   Genitourinary: Negative.   Musculoskeletal: Negative.   Skin: Negative.   Allergic/Immunologic: Negative.   Neurological: Negative.   Hematological: Negative.   Psychiatric/Behavioral: Negative.     Social History   Tobacco Use  . Smoking status: Former Research scientist (life sciences)  . Smokeless tobacco: Never Used  . Tobacco comment: quit 35 years ago  Substance Use Topics  . Alcohol use: Yes    Comment: wine or beer a glass a day usually   Objective:   BP 138/72 (BP Location: Left Arm, Patient Position: Sitting, Cuff Size: Normal)   Pulse (!) 52   Temp 98.4 F (36.9 C) (Oral)   Resp 16  Vitals:   09/21/17 0813  BP: 138/72  Pulse: (!) 52  Resp: 16  Temp: 98.4 F (36.9 C)  TempSrc: Oral     Physical Exam  Constitutional: He is oriented to person, place, and time. He appears well-developed and well-nourished.  HENT:  Head: Normocephalic and atraumatic.  Eyes: Conjunctivae are normal. No scleral  icterus.  Cardiovascular: Normal rate and regular rhythm.  Pulmonary/Chest: Effort normal.  Neurological: He is oriented to person, place, and time.  Psychiatric: He has a normal mood and affect. His behavior is normal. Judgment and thought content normal.        Assessment & Plan:     1. Hypercholesteremia  - Lipid panel - Comprehensive metabolic panel  2. Anxiety, generalized Needs SSRI. - TSH  3. Gastroesophageal reflux disease, esophagitis presence not specified Cannot stop PPI without symptoms. - CBC with Differential/Platelet  4. Depression, unspecified depression type  I have done the exam and reviewed the chart and it is accurate to the best of my knowledge. Development worker, community has been used and  any errors in dictation or transcription are unintentional. Miguel Aschoff M.D. [place], MD  [facility]

## 2017-09-22 LAB — CBC WITH DIFFERENTIAL/PLATELET
BASOS ABS: 0 10*3/uL (ref 0.0–0.2)
BASOS: 0 %
EOS (ABSOLUTE): 0.1 10*3/uL (ref 0.0–0.4)
Eos: 2 %
Hematocrit: 37.7 % (ref 37.5–51.0)
Hemoglobin: 12.5 g/dL — ABNORMAL LOW (ref 13.0–17.7)
IMMATURE GRANS (ABS): 0 10*3/uL (ref 0.0–0.1)
IMMATURE GRANULOCYTES: 1 %
LYMPHS: 22 %
Lymphocytes Absolute: 1 10*3/uL (ref 0.7–3.1)
MCH: 29.1 pg (ref 26.6–33.0)
MCHC: 33.2 g/dL (ref 31.5–35.7)
MCV: 88 fL (ref 79–97)
MONOS ABS: 0.5 10*3/uL (ref 0.1–0.9)
Monocytes: 10 %
NEUTROS PCT: 65 %
Neutrophils Absolute: 3.1 10*3/uL (ref 1.4–7.0)
PLATELETS: 135 10*3/uL — AB (ref 150–379)
RBC: 4.29 x10E6/uL (ref 4.14–5.80)
RDW: 13.8 % (ref 12.3–15.4)
WBC: 4.7 10*3/uL (ref 3.4–10.8)

## 2017-09-22 LAB — COMPREHENSIVE METABOLIC PANEL
A/G RATIO: 1.5 (ref 1.2–2.2)
ALBUMIN: 3.9 g/dL (ref 3.6–4.8)
ALT: 20 IU/L (ref 0–44)
AST: 34 IU/L (ref 0–40)
Alkaline Phosphatase: 88 IU/L (ref 39–117)
BILIRUBIN TOTAL: 0.4 mg/dL (ref 0.0–1.2)
BUN / CREAT RATIO: 17 (ref 10–24)
BUN: 18 mg/dL (ref 8–27)
CALCIUM: 8.7 mg/dL (ref 8.6–10.2)
CO2: 25 mmol/L (ref 20–29)
Chloride: 104 mmol/L (ref 96–106)
Creatinine, Ser: 1.06 mg/dL (ref 0.76–1.27)
GFR, EST AFRICAN AMERICAN: 85 mL/min/{1.73_m2} (ref >59)
GFR, EST NON AFRICAN AMERICAN: 73 mL/min/{1.73_m2} (ref >59)
Globulin, Total: 2.6 g/dL (ref 1.5–4.5)
Glucose: 109 mg/dL — ABNORMAL HIGH (ref 65–99)
POTASSIUM: 4.1 mmol/L (ref 3.5–5.2)
Sodium: 143 mmol/L (ref 134–144)
Total Protein: 6.5 g/dL (ref 6.0–8.5)

## 2017-09-22 LAB — LIPID PANEL
CHOLESTEROL TOTAL: 170 mg/dL (ref 100–199)
Chol/HDL Ratio: 4.5 ratio (ref 0.0–5.0)
HDL: 38 mg/dL — AB (ref >39)
LDL Calculated: 108 mg/dL — ABNORMAL HIGH (ref 0–99)
TRIGLYCERIDES: 118 mg/dL (ref 0–149)
VLDL CHOLESTEROL CAL: 24 mg/dL (ref 5–40)

## 2017-09-22 LAB — TSH: TSH: 3.17 u[IU]/mL (ref 0.450–4.500)

## 2017-09-25 NOTE — Progress Notes (Signed)
Advised  ED 

## 2017-10-23 ENCOUNTER — Other Ambulatory Visit: Payer: Self-pay | Admitting: Family Medicine

## 2017-10-23 DIAGNOSIS — F411 Generalized anxiety disorder: Secondary | ICD-10-CM

## 2018-02-22 DIAGNOSIS — R69 Illness, unspecified: Secondary | ICD-10-CM | POA: Diagnosis not present

## 2018-03-31 DIAGNOSIS — R69 Illness, unspecified: Secondary | ICD-10-CM | POA: Diagnosis not present

## 2018-04-08 DIAGNOSIS — M170 Bilateral primary osteoarthritis of knee: Secondary | ICD-10-CM | POA: Diagnosis not present

## 2018-06-13 DIAGNOSIS — R69 Illness, unspecified: Secondary | ICD-10-CM | POA: Diagnosis not present

## 2018-06-21 ENCOUNTER — Encounter: Payer: Self-pay | Admitting: Family Medicine

## 2018-07-01 ENCOUNTER — Encounter: Payer: Medicare HMO | Admitting: Family Medicine

## 2018-07-19 DIAGNOSIS — Z08 Encounter for follow-up examination after completed treatment for malignant neoplasm: Secondary | ICD-10-CM | POA: Diagnosis not present

## 2018-07-19 DIAGNOSIS — D2262 Melanocytic nevi of left upper limb, including shoulder: Secondary | ICD-10-CM | POA: Diagnosis not present

## 2018-07-19 DIAGNOSIS — D2271 Melanocytic nevi of right lower limb, including hip: Secondary | ICD-10-CM | POA: Diagnosis not present

## 2018-07-19 DIAGNOSIS — D2272 Melanocytic nevi of left lower limb, including hip: Secondary | ICD-10-CM | POA: Diagnosis not present

## 2018-07-19 DIAGNOSIS — D2261 Melanocytic nevi of right upper limb, including shoulder: Secondary | ICD-10-CM | POA: Diagnosis not present

## 2018-07-19 DIAGNOSIS — Z85828 Personal history of other malignant neoplasm of skin: Secondary | ICD-10-CM | POA: Diagnosis not present

## 2018-07-19 DIAGNOSIS — L281 Prurigo nodularis: Secondary | ICD-10-CM | POA: Diagnosis not present

## 2018-07-19 DIAGNOSIS — D225 Melanocytic nevi of trunk: Secondary | ICD-10-CM | POA: Diagnosis not present

## 2018-08-19 ENCOUNTER — Ambulatory Visit: Payer: Medicare HMO

## 2018-08-19 ENCOUNTER — Ambulatory Visit (INDEPENDENT_AMBULATORY_CARE_PROVIDER_SITE_OTHER): Payer: Medicare HMO | Admitting: Family Medicine

## 2018-08-19 ENCOUNTER — Other Ambulatory Visit: Payer: Self-pay | Admitting: Family Medicine

## 2018-08-19 VITALS — BP 132/80 | HR 61 | Temp 98.3°F | Resp 16 | Ht 73.0 in | Wt 225.0 lb

## 2018-08-19 DIAGNOSIS — Z23 Encounter for immunization: Secondary | ICD-10-CM

## 2018-08-19 DIAGNOSIS — Z125 Encounter for screening for malignant neoplasm of prostate: Secondary | ICD-10-CM

## 2018-08-19 DIAGNOSIS — F329 Major depressive disorder, single episode, unspecified: Secondary | ICD-10-CM

## 2018-08-19 DIAGNOSIS — R079 Chest pain, unspecified: Secondary | ICD-10-CM

## 2018-08-19 DIAGNOSIS — R69 Illness, unspecified: Secondary | ICD-10-CM | POA: Diagnosis not present

## 2018-08-19 DIAGNOSIS — E7800 Pure hypercholesterolemia, unspecified: Secondary | ICD-10-CM

## 2018-08-19 DIAGNOSIS — G4733 Obstructive sleep apnea (adult) (pediatric): Secondary | ICD-10-CM

## 2018-08-19 DIAGNOSIS — Z0000 Encounter for general adult medical examination without abnormal findings: Secondary | ICD-10-CM | POA: Diagnosis not present

## 2018-08-19 DIAGNOSIS — R5383 Other fatigue: Secondary | ICD-10-CM | POA: Diagnosis not present

## 2018-08-19 DIAGNOSIS — Z1211 Encounter for screening for malignant neoplasm of colon: Secondary | ICD-10-CM

## 2018-08-19 DIAGNOSIS — F32A Depression, unspecified: Secondary | ICD-10-CM

## 2018-08-19 LAB — IFOBT (OCCULT BLOOD): IFOBT: NEGATIVE

## 2018-08-19 NOTE — Progress Notes (Signed)
Patient: Steven Russell, Male    DOB: 11-03-1950, 68 y.o.   MRN: [id number] Visit Date: 08/19/2018  Today's Provider: Wilhemena Durie, MD   Chief Complaint  Patient presents with  . Annual Exam   Subjective:  Steven Russell is a 68 y.o. male who presents today for health maintenance and complete physical. He feels fairly well. He reports exercising at work with strenuous job. He reports he is sleeping well. Patient now has 5 grandchildren.  Overall his family is doing well. About 2 or 3 weeks ago the patient had chest pressure at night that lasted about 1 hour.  Periodic.  Exertional chest pain.  He is on Zantac and Nexium.  Immunization History  Administered Date(s) Administered  . Hepatitis A, Adult 07/04/2016  . Influenza,inj,Quad PF,6+ Mos 06/20/2015, 06/23/2016  . Influenza-Unspecified 06/13/2018  . Pneumococcal Conjugate-13 07/23/2017  . Td 03/25/2004  . Tdap 03/13/2014  . Zoster 12/07/2012   07/12/15 Colonoscopy, [place]-Diverticulosis, hemorrhoids, repeat 10 years.  Review of Systems  Constitutional: Positive for fatigue (Patient states he sleep more often.  Patien tdoes use CPAP.).  HENT: Negative.   Eyes: Negative.   Respiratory: Negative.   Cardiovascular: Positive for chest pain (tightness in the epigastric region, thinks it might be reflux).  Gastrointestinal: Positive for abdominal distention and abdominal pain.  Endocrine: Negative.   Genitourinary: Negative.   Musculoskeletal: Negative.   Skin: Negative.   Allergic/Immunologic: Negative.   Neurological: Negative.   Hematological: Negative.   Psychiatric/Behavioral: Negative.     Social History   Socioeconomic History  . Marital status: Married    Spouse name: Not on file  . Number of children: Not on file  . Years of education: Not on file  . Highest education level: Not on file  Occupational History  . Not on file  Social Needs  . Financial resource strain: Not on file  . Food insecurity:     Worry: Not on file    Inability: Not on file  . Transportation needs:    Medical: Not on file    Non-medical: Not on file  Tobacco Use  . Smoking status: Former Research scientist (life sciences)  . Smokeless tobacco: Never Used  . Tobacco comment: quit 35 years ago  Substance and Sexual Activity  . Alcohol use: Yes    Comment: wine or beer a glass a day usually  . Drug use: No  . Sexual activity: Not on file  Lifestyle  . Physical activity:    Days per week: Not on file    Minutes per session: Not on file  . Stress: Not on file  Relationships  . Social connections:    Talks on phone: Not on file    Gets together: Not on file    Attends religious service: Not on file    Active member of club or organization: Not on file    Attends meetings of clubs or organizations: Not on file    Relationship status: Not on file  . Intimate partner violence:    Fear of current or ex partner: Not on file    Emotionally abused: Not on file    Physically abused: Not on file    Forced sexual activity: Not on file  Other Topics Concern  . Not on file  Social History Narrative  . Not on file    Patient Active Problem List   Diagnosis Date Noted  . Allergic rhinitis 06/14/2015  . Basal cell carcinoma of skin 06/14/2015  . Clinical  depression 06/14/2015  . DD (diverticular disease) 06/14/2015  . Anxiety, generalized 06/14/2015  . Acid reflux 06/14/2015  . Hypercholesteremia 06/14/2015  . Cannot sleep 06/14/2015  . Arthritis, degenerative 06/14/2015  . Avitaminosis D 06/14/2015    Past Surgical History:  Procedure Laterality Date  . COLONOSCOPY WITH PROPOFOL N/A 07/12/2015   Procedure: COLONOSCOPY WITH PROPOFOL;  Surgeon: Manya Silvas, MD;  Location: [facility] ENDOSCOPY;  Service: Endoscopy;  Laterality: N/A;  . HERNIA REPAIR    . TONSILLECTOMY    . UPPER GI ENDOSCOPY  10/05/06   normal duodenum, reflux esophagitis and hiatus hernia  . VASECTOMY      His family history includes Brain cancer in his paternal  grandfather; Breast cancer in his mother; Diabetes in his daughter and sister; Glaucoma in his mother; Lung cancer in his father; Melanoma in his sister.     Outpatient Encounter Medications as of 08/19/2018  Medication Sig Note  . aspirin 81 MG tablet Take by mouth. 06/14/2015: Received from: [facility]  . esomeprazole (NEXIUM) 40 MG capsule Take by mouth. 06/14/2015: Received from: [facility]  . sertraline (ZOLOFT) 50 MG tablet TAKE 1 TABLETS BY MOUTH EVERY DAY   . [DISCONTINUED] ranitidine (ZANTAC) 150 MG tablet Take 1 tablet (150 mg total) by mouth at bedtime.    No facility-administered encounter medications on file as of 08/19/2018.     Patient Care Team: Jerrol Banana., MD as PCP - General (Family Medicine)      Objective:   Vitals:  Vitals:   08/19/18 1034  BP: 132/80  Pulse: 61  Resp: 16  Temp: 98.3 F (36.8 C)  TempSrc: Oral  SpO2: 95%  Weight: 225 lb (102.1 kg)  Height: 6\' 1"  (1.854 m)    Physical Exam Constitutional:      Appearance: Normal appearance. He is normal weight.  HENT:     Head: Normocephalic and atraumatic.     Right Ear: Tympanic membrane, ear canal and external ear normal.     Left Ear: Tympanic membrane, ear canal and external ear normal.     Nose: Nose normal.     Mouth/Throat:     Mouth: Mucous membranes are moist.     Pharynx: Oropharynx is clear.  Eyes:     Extraocular Movements: Extraocular movements intact.     Conjunctiva/sclera: Conjunctivae normal.     Pupils: Pupils are equal, round, and reactive to light.  Neck:     Musculoskeletal: Normal range of motion and neck supple.  Cardiovascular:     Rate and Rhythm: Normal rate and regular rhythm.     Pulses: Normal pulses.     Heart sounds: Normal heart sounds.  Pulmonary:     Effort: Pulmonary effort is normal.     Breath sounds: Normal breath sounds.  Abdominal:     General: Abdomen is flat. Bowel sounds are normal.     Palpations:  Abdomen is soft.  Genitourinary:    Penis: Normal.      Scrotum/Testes: Normal.     Prostate: Normal.     Rectum: Normal.  Musculoskeletal: Normal range of motion.  Skin:    General: Skin is warm and dry.  Neurological:     General: No focal deficit present.     Mental Status: He is alert and oriented to person, place, and time. Mental status is at baseline.  Psychiatric:        Mood and Affect: Mood normal.  Behavior: Behavior normal.        Thought Content: Thought content normal.        Judgment: Judgment normal.   ECG reveals sinus bradycardia with no ST or T wave changes. Fall Risk  08/19/2018 07/23/2017 06/20/2015  Falls in the past year? 0 No No   Depression Screen PHQ 2/9 Scores 08/19/2018 07/23/2017 07/23/2017 06/20/2015  PHQ - 2 Score 1 0 0 0  PHQ- 9 Score 6 2 - -   Functional Status Survey: Is the patient deaf or have difficulty hearing?: No Does the patient have difficulty seeing, even when wearing glasses/contacts?: No Does the patient have difficulty concentrating, remembering, or making decisions?: No Does the patient have difficulty walking or climbing stairs?: No Does the patient have difficulty dressing or bathing?: No Does the patient have difficulty doing errands alone such as visiting a doctor's office or shopping?: No    Office Visit from 08/19/2018 in [facility]  AUDIT-C Score  2      Assessment & Plan:     Routine Health Maintenance and Physical Exam  Exercise Activities and Dietary recommendations Goals   None     Immunization History  Administered Date(s) Administered  . Hepatitis A, Adult 07/04/2016  . Influenza,inj,Quad PF,6+ Mos 06/20/2015, 06/23/2016  . Influenza-Unspecified 06/13/2018  . Pneumococcal Conjugate-13 07/23/2017  . Td 03/25/2004  . Tdap 03/13/2014  . Zoster 12/07/2012    Health Maintenance  Topic Date Due  . PNA vac Low Risk Adult (2 of 2 - PPSV23) 07/23/2018  . TETANUS/TDAP  03/13/2024  .  COLONOSCOPY  07/11/2025  . INFLUENZA VACCINE  Completed  . Hepatitis C Screening  Completed     Discussed health benefits of physical activity, and encouraged him to engage in regular exercise appropriate for his age and condition.  1. Annual physical exam   2. Hypercholesteremia   3. Depression, unspecified depression type   Recheck in a few months on this.  4. Other fatigue  - CBC with Differential/Platelet - Comprehensive metabolic panel - TSH  5. OSA (obstructive sleep apnea)   6. Need for pneumococcal vaccination  - Pneumococcal polysaccharide vaccine 23-valent greater than or equal to 2yo subcutaneous/IM  7. Chest pain, unspecified type I think this is most likely reflux.  At this time change Zantac and Nexium to Dexilant 60 mg twice daily. - EKG 12-Lead  8. Prostate cancer screening  - PSA  9. Colon cancer screening  - IFOBT POC (occult bld, rslt in office); Future - IFOBT POC (occult bld, rslt in office)  I have done the exam and reviewed the chart and it is accurate to the best of my knowledge. Development worker, community has been used and  any errors in dictation or transcription are unintentional. Miguel Aschoff M.D. [facility]

## 2018-08-19 NOTE — Patient Instructions (Signed)
Start Dexliant 60mg  twice daily.

## 2018-08-20 LAB — COMPREHENSIVE METABOLIC PANEL
ALT: 27 IU/L (ref 0–44)
AST: 26 IU/L (ref 0–40)
Albumin/Globulin Ratio: 1.9 (ref 1.2–2.2)
Albumin: 4.5 g/dL (ref 3.8–4.8)
Alkaline Phosphatase: 100 IU/L (ref 39–117)
BUN/Creatinine Ratio: 16 (ref 10–24)
BUN: 18 mg/dL (ref 8–27)
Bilirubin Total: 0.3 mg/dL (ref 0.0–1.2)
CO2: 24 mmol/L (ref 20–29)
CREATININE: 1.1 mg/dL (ref 0.76–1.27)
Calcium: 9.3 mg/dL (ref 8.6–10.2)
Chloride: 104 mmol/L (ref 96–106)
GFR calc Af Amer: 80 mL/min/{1.73_m2} (ref >59)
GFR calc non Af Amer: 70 mL/min/{1.73_m2} (ref >59)
GLOBULIN, TOTAL: 2.4 g/dL (ref 1.5–4.5)
Glucose: 89 mg/dL (ref 65–99)
Potassium: 4.4 mmol/L (ref 3.5–5.2)
SODIUM: 142 mmol/L (ref 134–144)
Total Protein: 6.9 g/dL (ref 6.0–8.5)

## 2018-08-20 LAB — CBC WITH DIFFERENTIAL/PLATELET
Basophils Absolute: 0 10*3/uL (ref 0.0–0.2)
Basos: 1 %
EOS (ABSOLUTE): 0.1 10*3/uL (ref 0.0–0.4)
EOS: 1 %
HEMATOCRIT: 41.6 % (ref 37.5–51.0)
Hemoglobin: 13.7 g/dL (ref 13.0–17.7)
Immature Grans (Abs): 0 10*3/uL (ref 0.0–0.1)
Immature Granulocytes: 0 %
LYMPHS ABS: 1.1 10*3/uL (ref 0.7–3.1)
Lymphs: 19 %
MCH: 29.1 pg (ref 26.6–33.0)
MCHC: 32.9 g/dL (ref 31.5–35.7)
MCV: 88 fL (ref 79–97)
Monocytes Absolute: 0.6 10*3/uL (ref 0.1–0.9)
Monocytes: 11 %
Neutrophils Absolute: 3.8 10*3/uL (ref 1.4–7.0)
Neutrophils: 68 %
Platelets: 149 10*3/uL — ABNORMAL LOW (ref 150–450)
RBC: 4.71 x10E6/uL (ref 4.14–5.80)
RDW: 12.7 % (ref 11.6–15.4)
WBC: 5.5 10*3/uL (ref 3.4–10.8)

## 2018-08-20 LAB — PSA: Prostate Specific Ag, Serum: 1.7 ng/mL (ref 0.0–4.0)

## 2018-08-20 LAB — TSH: TSH: 2.62 u[IU]/mL (ref 0.450–4.500)

## 2018-08-27 ENCOUNTER — Other Ambulatory Visit: Payer: Self-pay | Admitting: Family Medicine

## 2018-08-27 DIAGNOSIS — F411 Generalized anxiety disorder: Secondary | ICD-10-CM

## 2018-09-02 ENCOUNTER — Ambulatory Visit (INDEPENDENT_AMBULATORY_CARE_PROVIDER_SITE_OTHER): Payer: Medicare HMO | Admitting: Family Medicine

## 2018-09-02 ENCOUNTER — Encounter: Payer: Self-pay | Admitting: Family Medicine

## 2018-09-02 VITALS — BP 130/72 | Temp 97.9°F | Resp 16 | Wt 227.0 lb

## 2018-09-02 DIAGNOSIS — R69 Illness, unspecified: Secondary | ICD-10-CM | POA: Diagnosis not present

## 2018-09-02 DIAGNOSIS — F411 Generalized anxiety disorder: Secondary | ICD-10-CM

## 2018-09-02 DIAGNOSIS — K219 Gastro-esophageal reflux disease without esophagitis: Secondary | ICD-10-CM

## 2018-09-02 MED ORDER — DEXLANSOPRAZOLE 60 MG PO CPDR: 60.0000 mg | DELAYED_RELEASE_CAPSULE | Freq: Every day | ORAL | 3 refills | Status: DC

## 2018-09-02 MED ORDER — SERTRALINE HCL 50 MG PO TABS: ORAL_TABLET | ORAL | 3 refills | Status: DC

## 2018-09-02 NOTE — Progress Notes (Signed)
Patient: Steven Russell Male    DOB: March 05, 1951   68 y.o.   MRN: [id number] Visit Date: 09/02/2018  Today's Provider: Wilhemena Durie, MD   Chief Complaint  Patient presents with  . Follow-up  . Chest Pain    possible GERD   Subjective:     HPI  Patient comes in today for a follow up. He was last seen in the office 2 weeks ago. At the time of visit, he was c/o chest pain, and he was prescribed Dexilant 60mg  twice daily.   Patient also mentions that he was to increase setraline to 75mg  daily. He reports that he never received the new Rx from the pharmacy. He is still currently taking the 50mg  tablet.    No Known Allergies   Current Outpatient Medications:  .  aspirin 81 MG tablet, Take by mouth., Disp: , Rfl:  .  sertraline (ZOLOFT) 50 MG tablet, TAKE 1 TABLETS BY MOUTH EVERY DAY, Disp: 90 tablet, Rfl: 1 .  esomeprazole (NEXIUM) 40 MG capsule, Take by mouth., Disp: , Rfl:   Review of Systems  Constitutional: Negative for activity change, appetite change, chills, fatigue and fever.  HENT: Negative.   Eyes: Negative.   Respiratory: Negative for cough and shortness of breath.   Cardiovascular: Negative for chest pain, palpitations and leg swelling.  Gastrointestinal: Negative.   Endocrine: Negative.   Allergic/Immunologic: Negative.   Neurological: Negative for dizziness and headaches.  Psychiatric/Behavioral: Negative.     Social History   Tobacco Use  . Smoking status: Former Research scientist (life sciences)  . Smokeless tobacco: Never Used  . Tobacco comment: quit 35 years ago  Substance Use Topics  . Alcohol use: Yes    Comment: wine or beer a glass a day usually      Objective:   BP 130/72 (BP Location: Right Arm, Patient Position: Sitting, Cuff Size: Normal)   Temp 97.9 F (36.6 C)   Resp 16   Wt 227 lb (103 kg)   BMI 29.95 kg/m  Vitals:   09/02/18 1110  BP: 130/72  Resp: 16  Temp: 97.9 F (36.6 C)  Weight: 227 lb (103 kg)     Physical Exam Constitutional:       Appearance: He is well-developed.  HENT:     Head: Normocephalic and atraumatic.  Eyes:     General: No scleral icterus.    Conjunctiva/sclera: Conjunctivae normal.  Cardiovascular:     Rate and Rhythm: Normal rate and regular rhythm.  Pulmonary:     Effort: Pulmonary effort is normal.     Breath sounds: Normal breath sounds.  Abdominal:     Palpations: Abdomen is soft.  Neurological:     Mental Status: He is oriented to person, place, and time.  Psychiatric:        Behavior: Behavior normal.        Thought Content: Thought content normal.        Judgment: Judgment normal.         Assessment & Plan    1. Anxiety, generalized Increase sertraline from 50 to 75 mg daily. - sertraline (ZOLOFT) 50 MG tablet; Take 1 1/2 tablet by mouth daily.  Dispense: 135 tablet; Refill: 3  2. Gastroesophageal reflux disease, esophagitis presence not specified  Dexilant 60 mg every morning.  Does much better on this medication.. - dexlansoprazole (DEXILANT) 60 MG capsule; Take 1 capsule (60 mg total) by mouth daily.  Dispense: 90 capsule; Refill: 3  I have done the exam and reviewed the above chart and it is accurate to the best of my knowledge. Development worker, community has been used in this note in any air is in the dictation or transcription are unintentional.  Wilhemena Durie, MD  [place]

## 2018-12-02 ENCOUNTER — Ambulatory Visit: Payer: Self-pay | Admitting: Family Medicine

## 2018-12-27 DIAGNOSIS — Z03818 Encounter for observation for suspected exposure to other biological agents ruled out: Secondary | ICD-10-CM | POA: Diagnosis not present

## 2019-03-20 ENCOUNTER — Other Ambulatory Visit: Payer: Self-pay | Admitting: Family Medicine

## 2019-03-20 DIAGNOSIS — F411 Generalized anxiety disorder: Secondary | ICD-10-CM

## 2019-05-05 DIAGNOSIS — R69 Illness, unspecified: Secondary | ICD-10-CM | POA: Diagnosis not present

## 2019-06-29 DIAGNOSIS — H524 Presbyopia: Secondary | ICD-10-CM | POA: Diagnosis not present

## 2019-07-04 DIAGNOSIS — R69 Illness, unspecified: Secondary | ICD-10-CM | POA: Diagnosis not present

## 2019-07-18 DIAGNOSIS — X32XXXA Exposure to sunlight, initial encounter: Secondary | ICD-10-CM | POA: Diagnosis not present

## 2019-07-18 DIAGNOSIS — D2271 Melanocytic nevi of right lower limb, including hip: Secondary | ICD-10-CM | POA: Diagnosis not present

## 2019-07-18 DIAGNOSIS — L570 Actinic keratosis: Secondary | ICD-10-CM | POA: Diagnosis not present

## 2019-07-18 DIAGNOSIS — D2261 Melanocytic nevi of right upper limb, including shoulder: Secondary | ICD-10-CM | POA: Diagnosis not present

## 2019-07-18 DIAGNOSIS — D2262 Melanocytic nevi of left upper limb, including shoulder: Secondary | ICD-10-CM | POA: Diagnosis not present

## 2019-07-18 DIAGNOSIS — D225 Melanocytic nevi of trunk: Secondary | ICD-10-CM | POA: Diagnosis not present

## 2019-07-18 DIAGNOSIS — Z85828 Personal history of other malignant neoplasm of skin: Secondary | ICD-10-CM | POA: Diagnosis not present

## 2019-07-18 DIAGNOSIS — L821 Other seborrheic keratosis: Secondary | ICD-10-CM | POA: Diagnosis not present

## 2019-10-24 NOTE — Progress Notes (Signed)
Subjective:   Steven Russell is a 69 y.o. male who presents for an Initial Medicare Annual Wellness Visit.    This visit is being conducted through telemedicine due to the COVID-19 pandemic. This patient has given me verbal consent via doximity to conduct this visit, patient states they are participating from their home address. Some vital signs may be absent or patient reported.    Patient identification: identified by name, DOB, and current address  Review of Systems  N/A  Cardiac Risk Factors include: advanced age (>59men, >75 women);male gender;hypertension    Objective:    Today's Vitals   10/25/19 0855  PainSc: 0-No pain   There is no height or weight on file to calculate BMI. Unable to obtain vitals due to visit being conducted via telephonically.   Advanced Directives 10/25/2019 07/12/2015  Does Patient Have a Medical Advance Directive? Yes Yes  Type of Paramedic of [place];Living will -  Copy of [place] in Chart? No - copy requested -    Current Medications (verified) Outpatient Encounter Medications as of 10/25/2019  Medication Sig  . aspirin 81 MG tablet Take 81 mg by mouth daily.   Marland Kitchen esomeprazole (NEXIUM) 40 MG capsule Take 40 mg by mouth daily at 12 noon.   . sertraline (ZOLOFT) 50 MG tablet TAKE 1 TABLET BY MOUTH EVERY DAY (Patient taking differently: Take 75 mg by mouth daily. )  . dexlansoprazole (DEXILANT) 60 MG capsule Take 1 capsule (60 mg total) by mouth daily. (Patient not taking: Reported on 10/25/2019)   No facility-administered encounter medications on file as of 10/25/2019.    Allergies (verified) Patient has no known allergies.   History: Past Medical History:  Diagnosis Date  . Depression   . GERD (gastroesophageal reflux disease)    Past Surgical History:  Procedure Laterality Date  . COLONOSCOPY WITH PROPOFOL N/A 07/12/2015   Procedure: COLONOSCOPY WITH PROPOFOL;  Surgeon: Manya Silvas,  MD;  Location: [facility] ENDOSCOPY;  Service: Endoscopy;  Laterality: N/A;  . HERNIA REPAIR    . TONSILLECTOMY    . UPPER GI ENDOSCOPY  10/05/06   normal duodenum, reflux esophagitis and hiatus hernia  . VASECTOMY     Family History  Problem Relation Age of Onset  . Breast cancer Mother   . Glaucoma Mother   . Lung cancer Father   . Diabetes Sister   . Diabetes Daughter   . Melanoma Sister   . Brain cancer Paternal Grandfather    Social History   Socioeconomic History  . Marital status: Married    Spouse name: Not on file  . Number of children: 2  . Years of education: Not on file  . Highest education level: Bachelor's degree (e.g., BA, AB, BS)  Occupational History  . Occupation: retired  Tobacco Use  . Smoking status: Former Research scientist (life sciences)  . Smokeless tobacco: Never Used  . Tobacco comment: quit 35 years ago  Substance and Sexual Activity  . Alcohol use: Yes    Comment: wine or beer a glass EOD  . Drug use: No  . Sexual activity: Not on file  Other Topics Concern  . Not on file  Social History Narrative  . Not on file   Social Determinants of Health   Financial Resource Strain: Low Risk   . Difficulty of Paying Living Expenses: Not hard at all  Food Insecurity: No Food Insecurity  . Worried About Charity fundraiser in the Last Year: Never  true  . Ran Out of Food in the Last Year: Never true  Transportation Needs: No Transportation Needs  . Lack of Transportation (Medical): No  . Lack of Transportation (Non-Medical): No  Physical Activity: Inactive  . Days of Exercise per Week: 0 days  . Minutes of Exercise per Session: 0 min  Stress: No Stress Concern Present  . Feeling of Stress : Only a little  Social Connections: Slightly Isolated  . Frequency of Communication with Friends and Family: More than three times a week  . Frequency of Social Gatherings with Friends and Family: More than three times a week  . Attends Religious Services: More than 4 times per year  .  Active Member of Clubs or Organizations: No  . Attends Archivist Meetings: Never  . Marital Status: Married   Tobacco Counseling Counseling given: Not Answered Comment: quit 35 years ago   Clinical Intake:  Pre-visit preparation completed: Yes  Pain : No/denies pain Pain Score: 0-No pain     Nutritional Risks: None Diabetes: No  How often do you need to have someone help you when you read instructions, pamphlets, or other written materials from your doctor or pharmacy?: 1 - Never  Interpreter Needed?: No  Information entered by :: [facility], LPN  Activities of Daily Living In your present state of health, do you have any difficulty performing the following activities: 10/25/2019  Hearing? N  Vision? N  Difficulty concentrating or making decisions? N  Walking or climbing stairs? N  Dressing or bathing? N  Doing errands, shopping? N  Preparing Food and eating ? N  Using the Toilet? N  In the past six months, have you accidently leaked urine? N  Do you have problems with loss of bowel control? N  Managing your Medications? N  Managing your Finances? N  Housekeeping or managing your Housekeeping? N  Some recent data might be hidden     Immunizations and Health Maintenance Immunization History  Administered Date(s) Administered  . Hepatitis A, Adult 07/04/2016  . Influenza, High Dose Seasonal PF 07/04/2019  . Influenza,inj,Quad PF,6+ Mos 06/20/2015, 06/23/2016  . Influenza-Unspecified 06/13/2018  . PFIZER SARS-COV-2 Vaccination 08/25/2019, 09/15/2019  . Pneumococcal Conjugate-13 07/23/2017  . Pneumococcal Polysaccharide-23 08/19/2018  . Td 03/25/2004  . Tdap 03/13/2014  . Zoster 12/07/2012  . Zoster Recombinat (Shingrix) 02/17/2019   There are no preventive care reminders to display for this patient.  Patient Care Team: Jerrol Banana., MD as PCP - General (Family Medicine) Dasher, Rayvon Char, MD (Dermatology)  Indicate any recent Medical  Services you may have received from other than [facility] providers in the past year (date may be approximate).    Assessment:   This is a routine wellness examination for Steven Russell.  Hearing/Vision screen No exam data present  Dietary issues and exercise activities discussed: Current Exercise Habits: The patient does not participate in regular exercise at present, Exercise limited by: None identified  Goals    . DIET - INCREASE WATER INTAKE     Recommend to drink at least 6-8 8oz glasses of water per day.      Depression Screen PHQ 2/9 Scores 10/25/2019 08/19/2018 07/23/2017 07/23/2017  PHQ - 2 Score 0 1 0 0  PHQ- 9 Score - 6 2 -    Fall Risk Fall Risk  10/25/2019 08/19/2018 07/23/2017 06/20/2015  Falls in the past year? 0 0 No No  Number falls in past yr: 0 - - -  Injury with Fall? 0 - - -  FALL RISK PREVENTION PERTAINING TO THE HOME:  Any stairs in or around the home? Yes  If so, are there any without handrails? No   Home free of loose throw rugs in walkways, pet beds, electrical cords, etc? Yes  Adequate lighting in your home to reduce risk of falls? Yes   ASSISTIVE DEVICES UTILIZED TO PREVENT FALLS:  Life alert? No  Use of a cane, walker or w/c? No  Grab bars in the bathroom? No  Shower chair or bench in shower? No  Elevated toilet seat or a handicapped toilet? No    TIMED UP AND GO:  Was the test performed? No .    Cognitive Function:     6CIT Screen 10/25/2019  What Year? 0 points  What month? 0 points  What time? 0 points  Count back from 20 0 points  Months in reverse 0 points  Repeat phrase 0 points  Total Score 0    Screening Tests Health Maintenance  Topic Date Due  . INFLUENZA VACCINE  02/12/2020  . TETANUS/TDAP  03/13/2024  . COLONOSCOPY  07/11/2025  . Hepatitis C Screening  Completed  . PNA vac Low Risk Adult  Completed    Qualifies for Shingles Vaccine? Yes , Shingrix # 2 due.  Tdap: Up to date  Flu Vaccine: Up to date  Pneumococcal  Vaccine: Completed series  Cancer Screenings:  Colorectal Screening: Completed 07/12/15. Repeat every 10 years.   Lung Cancer Screening: (Low Dose CT Chest recommended if Age 23-80 years, 30 pack-year currently smoking OR have quit w/in 15years.) does not qualify.   Additional Screening:  Hepatitis C Screening: Up to date  Vision Screening: Recommended annual ophthalmology exams for early detection of glaucoma and other disorders of the eye.  Dental Screening: Recommended annual dental exams for proper oral hygiene  Community Resource Referral:  CRR required this visit?  No        Plan:  I have personally reviewed and addressed the Medicare Annual Wellness questionnaire and have noted the following in the patient's chart:  A. Medical and social history B. Use of alcohol, tobacco or illicit drugs  C. Current medications and supplements D. Functional ability and status E.  Nutritional status F.  Physical activity G. Advance directives H. List of other physicians I.  Hospitalizations, surgeries, and ER visits in previous 12 months J.  [place] such as hearing and vision if needed, cognitive and depression L. Referrals and appointments   In addition, I have reviewed and discussed with patient certain preventive protocols, quality metrics, and best practice recommendations. A written personalized care plan for preventive services as well as general preventive health recommendations were provided to patient.   [place] Score, [place]   [id number]  Nurse Health Advisor   Nurse Notes: None.

## 2019-10-25 ENCOUNTER — Ambulatory Visit (INDEPENDENT_AMBULATORY_CARE_PROVIDER_SITE_OTHER): Payer: Medicare HMO

## 2019-10-25 ENCOUNTER — Other Ambulatory Visit: Payer: Self-pay

## 2019-10-25 DIAGNOSIS — Z0000 Encounter for general adult medical examination without abnormal findings: Secondary | ICD-10-CM | POA: Diagnosis not present

## 2019-10-25 NOTE — Patient Instructions (Signed)
Steven Russell , Thank you for taking time to come for your Medicare Wellness Visit. I appreciate your ongoing commitment to your health goals. Please review the following plan we discussed and let me know if I can assist you in the future.   Screening recommendations/referrals: Colonoscopy: Up to date, due 06/2025 Recommended yearly ophthalmology/optometry visit for glaucoma screening and checkup Recommended yearly dental visit for hygiene and checkup  Vaccinations: Influenza vaccine: Up to date Pneumococcal vaccine: Completed series Tdap vaccine: Up to date Shingles vaccine: Shingrix #2 due    Advanced directives: Please bring a copy of your POA (Power of Attorney) and/or Living Will to your next appointment.   Conditions/risks identified: Recommend to drink at least 6-8 8oz glasses of water per day.  Next appointment: 11/08/19 @ 2:00 PM with Dr Rosanna Randy   Preventive Care 25 Years and Older, Male Preventive care refers to lifestyle choices and visits with your health care provider that can promote health and wellness. What does preventive care include?  A yearly physical exam. This is also called an annual well check.  Dental exams once or twice a year.  Routine eye exams. Ask your health care provider how often you should have your eyes checked.  Personal lifestyle choices, including:  Daily care of your teeth and gums.  Regular physical activity.  Eating a healthy diet.  Avoiding tobacco and drug use.  Limiting alcohol use.  Practicing safe sex.  Taking low doses of aspirin every day.  Taking vitamin and mineral supplements as recommended by your health care provider. What happens during an annual well check? The services and screenings done by your health care provider during your annual well check will depend on your age, overall health, lifestyle risk factors, and family history of disease. Counseling  Your health care provider may ask you questions about  your:  Alcohol use.  Tobacco use.  Drug use.  Emotional well-being.  Home and relationship well-being.  Sexual activity.  Eating habits.  History of falls.  Memory and ability to understand (cognition).  Work and work Statistician. Screening  You may have the following tests or measurements:  Height, weight, and BMI.  Blood pressure.  Lipid and cholesterol levels. These may be checked every 5 years, or more frequently if you are over 68 years old.  Skin check.  Lung cancer screening. You may have this screening every year starting at age 29 if you have a 30-pack-year history of smoking and currently smoke or have quit within the past 15 years.  Fecal occult blood test (FOBT) of the stool. You may have this test every year starting at age 29.  Flexible sigmoidoscopy or colonoscopy. You may have a sigmoidoscopy every 5 years or a colonoscopy every 10 years starting at age 70.  Prostate cancer screening. Recommendations will vary depending on your family history and other risks.  Hepatitis C blood test.  Hepatitis B blood test.  Sexually transmitted disease (STD) testing.  Diabetes screening. This is done by checking your blood sugar (glucose) after you have not eaten for a while (fasting). You may have this done every 1-3 years.  Abdominal aortic aneurysm (AAA) screening. You may need this if you are a current or former smoker.  Osteoporosis. You may be screened starting at age 65 if you are at high risk. Talk with your health care provider about your test results, treatment options, and if necessary, the need for more tests. Vaccines  Your health care provider may recommend certain vaccines,  such as:  Influenza vaccine. This is recommended every year.  Tetanus, diphtheria, and acellular pertussis (Tdap, Td) vaccine. You may need a Td booster every 10 years.  Zoster vaccine. You may need this after age 63.  Pneumococcal 13-valent conjugate (PCV13) vaccine.  One dose is recommended after age 25.  Pneumococcal polysaccharide (PPSV23) vaccine. One dose is recommended after age 49. Talk to your health care provider about which screenings and vaccines you need and how often you need them. This information is not intended to replace advice given to you by your health care provider. Make sure you discuss any questions you have with your health care provider. Document Released: 07/27/2015 Document Revised: 03/19/2016 Document Reviewed: 05/01/2015 Elsevier Interactive Patient Education  2017 [place] Prevention in the Home Falls can cause injuries. They can happen to people of all ages. There are many things you can do to make your home safe and to help prevent falls. What can I do on the outside of my home?  Regularly fix the edges of walkways and driveways and fix any cracks.  Remove anything that might make you trip as you walk through a door, such as a raised step or threshold.  Trim any bushes or trees on the path to your home.  Use bright outdoor lighting.  Clear any walking paths of anything that might make someone trip, such as rocks or tools.  Regularly check to see if handrails are loose or broken. Make sure that both sides of any steps have handrails.  Any raised decks and porches should have guardrails on the edges.  Have any leaves, snow, or ice cleared regularly.  Use sand or salt on walking paths during winter.  Clean up any spills in your garage right away. This includes oil or grease spills. What can I do in the bathroom?  Use night lights.  Install grab bars by the toilet and in the tub and shower. Do not use towel bars as grab bars.  Use non-skid mats or decals in the tub or shower.  If you need to sit down in the shower, use a plastic, non-slip stool.  Keep the floor dry. Clean up any water that spills on the floor as soon as it happens.  Remove soap buildup in the tub or shower regularly.  Attach bath  mats securely with double-sided non-slip rug tape.  Do not have throw rugs and other things on the floor that can make you trip. What can I do in the bedroom?  Use night lights.  Make sure that you have a light by your bed that is easy to reach.  Do not use any sheets or blankets that are too big for your bed. They should not hang down onto the floor.  Have a firm chair that has side arms. You can use this for support while you get dressed.  Do not have throw rugs and other things on the floor that can make you trip. What can I do in the kitchen?  Clean up any spills right away.  Avoid walking on wet floors.  Keep items that you use a lot in easy-to-reach places.  If you need to reach something above you, use a strong step stool that has a grab bar.  Keep electrical cords out of the way.  Do not use floor polish or wax that makes floors slippery. If you must use wax, use non-skid floor wax.  Do not have throw rugs and other things on  the floor that can make you trip. What can I do with my stairs?  Do not leave any items on the stairs.  Make sure that there are handrails on both sides of the stairs and use them. Fix handrails that are broken or loose. Make sure that handrails are as long as the stairways.  Check any carpeting to make sure that it is firmly attached to the stairs. Fix any carpet that is loose or worn.  Avoid having throw rugs at the top or bottom of the stairs. If you do have throw rugs, attach them to the floor with carpet tape.  Make sure that you have a light switch at the top of the stairs and the bottom of the stairs. If you do not have them, ask someone to add them for you. What else can I do to help prevent falls?  Wear shoes that:  Do not have high heels.  Have rubber bottoms.  Are comfortable and fit you well.  Are closed at the toe. Do not wear sandals.  If you use a stepladder:  Make sure that it is fully opened. Do not climb a closed  stepladder.  Make sure that both sides of the stepladder are locked into place.  Ask someone to hold it for you, if possible.  Clearly mark and make sure that you can see:  Any grab bars or handrails.  First and last steps.  Where the edge of each step is.  Use tools that help you move around (mobility aids) if they are needed. These include:  Canes.  Walkers.  Scooters.  Crutches.  Turn on the lights when you go into a dark area. Replace any light bulbs as soon as they burn out.  Set up your furniture so you have a clear path. Avoid moving your furniture around.  If any of your floors are uneven, fix them.  If there are any pets around you, be aware of where they are.  Review your medicines with your doctor. Some medicines can make you feel dizzy. This can increase your chance of falling. Ask your doctor what other things that you can do to help prevent falls. This information is not intended to replace advice given to you by your health care provider. Make sure you discuss any questions you have with your health care provider. Document Released: 04/26/2009 Document Revised: 12/06/2015 Document Reviewed: 08/04/2014 Elsevier Interactive Patient Education  2017 [employer].

## 2019-10-26 ENCOUNTER — Other Ambulatory Visit: Payer: Self-pay | Admitting: Family Medicine

## 2019-10-26 DIAGNOSIS — F411 Generalized anxiety disorder: Secondary | ICD-10-CM

## 2019-10-26 NOTE — Telephone Encounter (Signed)
Requested medications are due for refill today?  Yes  Requested medications are on active medication list?  Yes  Last Refill:  03/20/2019  # 90 with 1 refill  Future visit scheduled?  No  Notes to Clinic:  Medication failed RX refill protocol due to no valid encounter in the last 6 months.  Last visit one year ago.

## 2019-10-26 NOTE — Telephone Encounter (Signed)
LOV  09/02/18 Steven Russell, appt on 11/08/19 10/25/19 [name]  LRF  03/20/19 90 x 1    Depression screen PHQ 2/9 10/25/2019 08/19/2018 07/23/2017 07/23/2017 06/20/2015  Decreased Interest 0 1 0 0 0  Down, Depressed, Hopeless 0 0 0 0 0  PHQ - 2 Score 0 1 0 0 0  Altered sleeping - 1 1 - -  Tired, decreased energy - 1 0 - -  Change in appetite - 2 1 - -  Feeling bad or failure about yourself  - 1 0 - -  Trouble concentrating - 0 0 - -  Moving slowly or fidgety/restless - 0 0 - -  Suicidal thoughts - 0 0 - -  PHQ-9 Score - 6 2 - -  Difficult doing work/chores - Somewhat difficult Not difficult at all - -

## 2019-11-04 NOTE — Progress Notes (Signed)
Complete physical exam  I,Steven Russell,acting as a scribe for Wilhemena Durie, MD.,have documented all relevant documentation on the behalf of Wilhemena Durie, MD,as directed by  Wilhemena Durie, MD while in the presence of Wilhemena Durie, MD.   Patient: Steven Russell   DOB: [birth year]   69 y.o. Male  MRN: [id number] Visit Date: 11/09/2019  Today's healthcare provider: Wilhemena Durie, MD   Chief Complaint  Patient presents with  . Annual Exam   Subjective    Steven Russell is a 69 y.o. male who presents today for a complete physical exam.  He reports consuming a general diet. The patient does not participate in regular exercise at present. He generally feels well. He reports sleeping well/with CPAP. He does not have additional problems to discuss today.  He is married and is a father of 2 daughters and 5 grandchildren.  Overall he is feeling well.  He is doing well with Zoloft at 75 mg daily.  He wishes no changes to this. HPI  He sees Dr. Evorn Gong yearly for sun-damaged skin. Patient had AWV with [facility] on 10/25/2019.   Past Medical History:  Diagnosis Date  . Depression   . GERD (gastroesophageal reflux disease)    Past Surgical History:  Procedure Laterality Date  . COLONOSCOPY WITH PROPOFOL N/A 07/12/2015   Procedure: COLONOSCOPY WITH PROPOFOL;  Surgeon: Manya Silvas, MD;  Location: [facility] ENDOSCOPY;  Service: Endoscopy;  Laterality: N/A;  . HERNIA REPAIR    . TONSILLECTOMY    . UPPER GI ENDOSCOPY  10/05/06   normal duodenum, reflux esophagitis and hiatus hernia  . VASECTOMY     Social History   Socioeconomic History  . Marital status: Married    Spouse name: Not on file  . Number of children: 2  . Years of education: Not on file  . Highest education level: Bachelor's degree (e.g., BA, AB, BS)  Occupational History  . Occupation: retired  Tobacco Use  . Smoking status: Former Research scientist (life sciences)  . Smokeless tobacco: Never Used  . Tobacco comment: quit 35  years ago  Substance and Sexual Activity  . Alcohol use: Yes    Comment: wine or beer a glass EOD  . Drug use: No  . Sexual activity: Not on file  Other Topics Concern  . Not on file  Social History Narrative  . Not on file   Social Determinants of Health   Financial Resource Strain: Low Risk   . Difficulty of Paying Living Expenses: Not hard at all  Food Insecurity: No Food Insecurity  . Worried About Charity fundraiser in the Last Year: Never true  . Ran Out of Food in the Last Year: Never true  Transportation Needs: No Transportation Needs  . Lack of Transportation (Medical): No  . Lack of Transportation (Non-Medical): No  Physical Activity: Inactive  . Days of Exercise per Week: 0 days  . Minutes of Exercise per Session: 0 min  Stress: No Stress Concern Present  . Feeling of Stress : Only a little  Social Connections: Slightly Isolated  . Frequency of Communication with Friends and Family: More than three times a week  . Frequency of Social Gatherings with Friends and Family: More than three times a week  . Attends Religious Services: More than 4 times per year  . Active Member of Clubs or Organizations: No  . Attends Archivist Meetings: Never  . Marital Status: Married  Human resources officer Violence:  Not At Risk  . Fear of Current or Ex-Partner: No  . Emotionally Abused: No  . Physically Abused: No  . Sexually Abused: No   Family Status  Relation Name Status  . Mother  Deceased at age 24  . Father  Deceased at age 15  . Sister  Alive  . Brother  Alive  . Daughter  Alive  . Daughter  Alive  . Sister  Alive  . PGF  (Not Specified)   Family History  Problem Relation Age of Onset  . Breast cancer Mother   . Glaucoma Mother   . Lung cancer Father   . Diabetes Sister   . Diabetes Daughter   . Melanoma Sister   . Brain cancer Paternal Grandfather    No Known Allergies  Patient Care Team: Jerrol Banana., MD as PCP - General (Family  Medicine) Dasher, Rayvon Char, MD (Dermatology)   Medications: Outpatient Medications Prior to Visit  Medication Sig  . aspirin 81 MG tablet Take 81 mg by mouth daily.   Marland Kitchen esomeprazole (NEXIUM) 40 MG capsule Take 40 mg by mouth daily at 12 noon.   . [DISCONTINUED] sertraline (ZOLOFT) 50 MG tablet Take 1 tablet (50 mg total) by mouth daily. 1 5 [place] Progression Recent Vital Signs  @VS @   Past Medical History: No date: Depression No date: GERD (gastroesophageal reflux disease)   Expected Discharge Date    Diet Order    None      VTE Documentation     Work Intensity Score/Level of Care    @LEVELOFCARE @   Mobility       Significant Events   1.5 tablets daily (Patient taking differently: Take 75 mg by mouth daily. 1 5 West Progression Recent Vital Signs  @VS @   Past Medical History: No date: Depression No date: GERD (gastroesophageal reflux disease)   Expected Discharge Date    Diet Order    None      VTE Documentation     Work Intensity Score/Level of Care    @LEVELOFCARE @   Mobility       Significant Events   1.5 tablets daily)  . dexlansoprazole (DEXILANT) 60 MG capsule Take 1 capsule (60 mg total) by mouth daily. (Patient not taking: Reported on 10/25/2019)   No facility-administered medications prior to visit.    Review of Systems  Constitutional: Negative.   HENT: Negative.   Eyes: Negative.   Respiratory: Positive for apnea.        Patient states his snoring is not a problem, his wife does not complain of a progressive issue.  Cardiovascular: Negative.   Gastrointestinal: Negative.   Endocrine: Negative.   Genitourinary: Negative.   Allergic/Immunologic: Negative.   Neurological: Negative.   Hematological: Bruises/bleeds easily.       He really notices this only on his forearms and his hands.  Psychiatric/Behavioral: Negative.        Objective    BP (!) 145/79 (BP Location: Right Arm, Patient Position: Sitting, Cuff Size:  Large)   Pulse 65   Temp (!) 96.9 F (36.1 C) (Other (Comment))   Resp 16   Ht 6\' 1"  (1.854 m)   Wt 226 lb (102.5 kg)   SpO2 97%   BMI 29.82 kg/m  BP Readings from Last 3 Encounters:  11/08/19 (!) 145/79  09/02/18 130/72  08/19/18 132/80   Wt Readings from Last 3 Encounters:  11/08/19 226 lb (102.5 kg)  09/02/18 227 lb (103 kg)  08/19/18 225  lb (102.1 kg)      Physical Exam Vitals reviewed.  Constitutional:      Appearance: Normal appearance. He is normal weight.  HENT:     Head: Normocephalic and atraumatic.     Right Ear: Tympanic membrane, ear canal and external ear normal.     Left Ear: Tympanic membrane, ear canal and external ear normal.     Nose: Nose normal.     Mouth/Throat:     Mouth: Mucous membranes are moist.     Pharynx: Oropharynx is clear.  Eyes:     Extraocular Movements: Extraocular movements intact.     Conjunctiva/sclera: Conjunctivae normal.     Pupils: Pupils are equal, round, and reactive to light.  Cardiovascular:     Rate and Rhythm: Normal rate and regular rhythm.     Pulses: Normal pulses.     Heart sounds: Murmur present.     Comments: 1/6 to 2/6 soft systolic murmur over right upper sternal border Pulmonary:     Effort: Pulmonary effort is normal.     Breath sounds: Normal breath sounds.  Abdominal:     General: Abdomen is flat. Bowel sounds are normal.     Palpations: Abdomen is soft.  Genitourinary:    Penis: Normal.      Testes: Normal.     Prostate: Normal.     Rectum: Normal.  Musculoskeletal:        General: Normal range of motion.     Cervical back: Normal range of motion and neck supple.  Skin:    General: Skin is warm and dry.  Neurological:     General: No focal deficit present.     Mental Status: He is alert and oriented to person, place, and time. Mental status is at baseline.  Psychiatric:        Mood and Affect: Mood normal.        Behavior: Behavior normal.        Thought Content: Thought content normal.         Judgment: Judgment normal.       Depression Screen  PHQ 2/9 Scores 10/25/2019 08/19/2018 07/23/2017  PHQ - 2 Score 0 1 0  PHQ- 9 Score - 6 2    Results for orders placed or performed in visit on 11/08/19  POCT urinalysis dipstick  Result Value Ref Range   Color, UA Yellow    Clarity, UA Clear    Glucose, UA Negative Negative   Bilirubin, UA Negative    Ketones, UA Negative    Spec Grav, UA 1.025 1.010 - 1.025   Blood, UA Negative    pH, UA 6.0 5.0 - 8.0   Protein, UA Negative Negative   Urobilinogen, UA 0.2 0.2 or 1.0 E.U./dL   Nitrite, UA Negative    Leukocytes, UA Negative Negative   Appearance Normal    Odor Normal     Assessment & Plan    Routine Health Maintenance and Physical Exam  Exercise Activities and Dietary recommendations Goals    . DIET - INCREASE WATER INTAKE     Recommend to drink at least 6-8 8oz glasses of water per day.       Immunization History  Administered Date(s) Administered  . Hepatitis A, Adult 07/04/2016  . Influenza, High Dose Seasonal PF 07/04/2019  . Influenza,inj,Quad PF,6+ Mos 06/20/2015, 06/23/2016  . Influenza-Unspecified 06/13/2018  . PFIZER SARS-COV-2 Vaccination 08/25/2019, 09/15/2019  . Pneumococcal Conjugate-13 07/23/2017  . Pneumococcal Polysaccharide-23 08/19/2018  . Td 03/25/2004  .  Tdap 03/13/2014  . Zoster 12/07/2012  . Zoster Recombinat (Shingrix) 02/17/2019    Health Maintenance  Topic Date Due  . INFLUENZA VACCINE  02/12/2020  . TETANUS/TDAP  03/13/2024  . COLONOSCOPY  07/11/2025  . COVID-19 Vaccine  Completed  . Hepatitis C Screening  Completed  . PNA vac Low Risk Adult  Completed    Discussed health benefits of physical activity, and encouraged him to engage in regular exercise appropriate for his age and condition.  1. Annual physical exam Colonoscopy due 07/11/2025. - CBC w/Diff/Platelet - Lipid panel - TSH - Comprehensive Metabolic Panel (CMET) - PSA - POCT urinalysis  dipstick--Normal  2. Hypercholesteremia  - CBC w/Diff/Platelet - Lipid panel - TSH - Comprehensive Metabolic Panel (CMET)   3. Prostate cancer screening  - PSA  4.  GERD Continue PPI for now.  Discuss on next  Visit.  5.  Chronic mild depression and anxiety. Trolled with Zoloft 75 mg daily.    Return in about 1 year (around 11/12/2020).     I, Steven Russell, CMA, have reviewed all documentation for this visit. The documentation on 11/09/19 for the exam, diagnosis, procedures, and orders are all accurate and complete.    Eileen Croswell Cranford Mon, MD  [facility] [phone] (phone) [phone] (fax)  [place]

## 2019-11-08 ENCOUNTER — Ambulatory Visit (INDEPENDENT_AMBULATORY_CARE_PROVIDER_SITE_OTHER): Payer: Medicare HMO | Admitting: Family Medicine

## 2019-11-08 ENCOUNTER — Other Ambulatory Visit: Payer: Self-pay

## 2019-11-08 ENCOUNTER — Encounter: Payer: Self-pay | Admitting: Family Medicine

## 2019-11-08 VITALS — BP 145/79 | HR 65 | Temp 96.9°F | Resp 16 | Ht 73.0 in | Wt 226.0 lb

## 2019-11-08 DIAGNOSIS — R69 Illness, unspecified: Secondary | ICD-10-CM | POA: Diagnosis not present

## 2019-11-08 DIAGNOSIS — F411 Generalized anxiety disorder: Secondary | ICD-10-CM

## 2019-11-08 DIAGNOSIS — Z0000 Encounter for general adult medical examination without abnormal findings: Secondary | ICD-10-CM | POA: Diagnosis not present

## 2019-11-08 DIAGNOSIS — E7800 Pure hypercholesterolemia, unspecified: Secondary | ICD-10-CM | POA: Diagnosis not present

## 2019-11-08 DIAGNOSIS — Z1211 Encounter for screening for malignant neoplasm of colon: Secondary | ICD-10-CM | POA: Diagnosis not present

## 2019-11-08 DIAGNOSIS — F32A Depression, unspecified: Secondary | ICD-10-CM

## 2019-11-08 DIAGNOSIS — Z125 Encounter for screening for malignant neoplasm of prostate: Secondary | ICD-10-CM

## 2019-11-08 DIAGNOSIS — F329 Major depressive disorder, single episode, unspecified: Secondary | ICD-10-CM

## 2019-11-08 DIAGNOSIS — K219 Gastro-esophageal reflux disease without esophagitis: Secondary | ICD-10-CM | POA: Diagnosis not present

## 2019-11-08 MED ORDER — SERTRALINE HCL 50 MG PO TABS: ORAL_TABLET | ORAL | 2 refills | Status: DC

## 2019-11-09 DIAGNOSIS — E7800 Pure hypercholesterolemia, unspecified: Secondary | ICD-10-CM | POA: Diagnosis not present

## 2019-11-09 DIAGNOSIS — Z0000 Encounter for general adult medical examination without abnormal findings: Secondary | ICD-10-CM | POA: Diagnosis not present

## 2019-11-09 DIAGNOSIS — Z125 Encounter for screening for malignant neoplasm of prostate: Secondary | ICD-10-CM | POA: Diagnosis not present

## 2019-11-09 LAB — POCT URINALYSIS DIPSTICK
Appearance: NORMAL
Bilirubin, UA: NEGATIVE
Blood, UA: NEGATIVE
Glucose, UA: NEGATIVE
Ketones, UA: NEGATIVE
Leukocytes, UA: NEGATIVE
Nitrite, UA: NEGATIVE
Odor: NORMAL
Protein, UA: NEGATIVE
Spec Grav, UA: 1.025 (ref 1.010–1.025)
Urobilinogen, UA: 0.2 E.U./dL
pH, UA: 6 (ref 5.0–8.0)

## 2019-11-10 LAB — CBC WITH DIFFERENTIAL/PLATELET
Basophils Absolute: 0 10*3/uL (ref 0.0–0.2)
Basos: 1 %
EOS (ABSOLUTE): 0.1 10*3/uL (ref 0.0–0.4)
Eos: 1 %
Hematocrit: 39.1 % (ref 37.5–51.0)
Hemoglobin: 13.3 g/dL (ref 13.0–17.7)
Immature Grans (Abs): 0 10*3/uL (ref 0.0–0.1)
Immature Granulocytes: 1 %
Lymphocytes Absolute: 1.4 10*3/uL (ref 0.7–3.1)
Lymphs: 26 %
MCH: 29.6 pg (ref 26.6–33.0)
MCHC: 34 g/dL (ref 31.5–35.7)
MCV: 87 fL (ref 79–97)
Monocytes Absolute: 0.6 10*3/uL (ref 0.1–0.9)
Monocytes: 11 %
Neutrophils Absolute: 3.2 10*3/uL (ref 1.4–7.0)
Neutrophils: 60 %
Platelets: 144 10*3/uL — ABNORMAL LOW (ref 150–450)
RBC: 4.5 x10E6/uL (ref 4.14–5.80)
RDW: 12.5 % (ref 11.6–15.4)
WBC: 5.2 10*3/uL (ref 3.4–10.8)

## 2019-11-10 LAB — COMPREHENSIVE METABOLIC PANEL
ALT: 9 IU/L (ref 0–44)
AST: 17 IU/L (ref 0–40)
Albumin/Globulin Ratio: 1.7 (ref 1.2–2.2)
Albumin: 4.3 g/dL (ref 3.8–4.8)
Alkaline Phosphatase: 96 IU/L (ref 39–117)
BUN/Creatinine Ratio: 20 (ref 10–24)
BUN: 22 mg/dL (ref 8–27)
Bilirubin Total: 0.2 mg/dL (ref 0.0–1.2)
CO2: 23 mmol/L (ref 20–29)
Calcium: 9 mg/dL (ref 8.6–10.2)
Chloride: 103 mmol/L (ref 96–106)
Creatinine, Ser: 1.12 mg/dL (ref 0.76–1.27)
GFR calc Af Amer: 78 mL/min/{1.73_m2} (ref >59)
GFR calc non Af Amer: 68 mL/min/{1.73_m2} (ref >59)
Globulin, Total: 2.5 g/dL (ref 1.5–4.5)
Glucose: 109 mg/dL — ABNORMAL HIGH (ref 65–99)
Potassium: 4.4 mmol/L (ref 3.5–5.2)
Sodium: 139 mmol/L (ref 134–144)
Total Protein: 6.8 g/dL (ref 6.0–8.5)

## 2019-11-10 LAB — LIPID PANEL
Chol/HDL Ratio: 5.1 ratio — ABNORMAL HIGH (ref 0.0–5.0)
Cholesterol, Total: 174 mg/dL (ref 100–199)
HDL: 34 mg/dL — ABNORMAL LOW (ref >39)
LDL Chol Calc (NIH): 117 mg/dL — ABNORMAL HIGH (ref 0–99)
Triglycerides: 127 mg/dL (ref 0–149)
VLDL Cholesterol Cal: 23 mg/dL (ref 5–40)

## 2019-11-10 LAB — TSH: TSH: 4.04 u[IU]/mL (ref 0.450–4.500)

## 2019-11-10 LAB — PSA: Prostate Specific Ag, Serum: 1.3 ng/mL (ref 0.0–4.0)

## 2019-11-29 DIAGNOSIS — R69 Illness, unspecified: Secondary | ICD-10-CM | POA: Diagnosis not present

## 2019-12-26 DIAGNOSIS — R69 Illness, unspecified: Secondary | ICD-10-CM | POA: Diagnosis not present

## 2020-01-19 ENCOUNTER — Ambulatory Visit: Payer: Self-pay | Admitting: Family Medicine

## 2020-02-08 ENCOUNTER — Telehealth: Payer: Self-pay

## 2020-02-08 NOTE — Progress Notes (Signed)
Trena Platt Cummings,acting as a scribe for Wilhemena Durie, MD.,have documented all relevant documentation on the behalf of Wilhemena Durie, MD,as directed by  Wilhemena Durie, MD while in the presence of Wilhemena Durie, MD.  Established patient visit   Patient: Steven Russell   DOB: July 27, 1950   69 y.o. Male  MRN: [id number] Visit Date: 02/09/2020  Today's healthcare provider: Wilhemena Durie, MD   Chief Complaint  Patient presents with  . Jaw Pain  . Shoulder Pain   Subjective    Shoulder Pain  The pain is present in the left shoulder. This is a chronic problem. The current episode started in the past 7 days. The quality of the pain is described as aching. Pertinent negatives include no fever, joint locking, joint swelling, numbness, stiffness or tingling. He has tried acetaminophen for the symptoms. The treatment provided moderate relief.  He was in [place] with his wife and granddaughter and was walking about 10 miles a day and to the end of the one of the days he had left jaw pain radiating up into his left shoulder after they get through with her walking.  This pain lasted for about an hour.  No associated symptoms i.e. no shortness of breath diaphoresis palpitations or chest pain at the time.  Since this time he has felt well until the last couple of days when he has had a nonspecific twinge in his left lower chest.  Again, no other symptoms. Jaw pain started around the same time that shoulder pain started. Both jaw and shoulder pain have stopped.   Patient says that he was noticing some chest discomfort yesterday.      Medications: Outpatient Medications Prior to Visit  Medication Sig  . aspirin 81 MG tablet Take 81 mg by mouth daily.   Marland Kitchen esomeprazole (NEXIUM) 40 MG capsule Take 40 mg by mouth daily at 12 noon.   Marland Kitchen dexlansoprazole (DEXILANT) 60 MG capsule Take 1 capsule (60 mg total) by mouth daily. (Patient not taking: Reported on 10/25/2019)  .  sertraline (ZOLOFT) 50 MG tablet Take 1 1/2 tablets daily. (Patient taking differently: Take 75 mg by mouth daily. Take 1 1/2 tablets daily.)   No facility-administered medications prior to visit.    Review of Systems  Constitutional: Negative for appetite change, chills and fever.  Respiratory: Negative for chest tightness, shortness of breath and wheezing.   Cardiovascular: Negative for chest pain and palpitations.  Gastrointestinal: Negative for abdominal pain, nausea and vomiting.  Musculoskeletal: Negative for stiffness.  Neurological: Negative for tingling and numbness.       Objective    BP (!) 165/84 (BP Location: Right Arm, Patient Position: Sitting, Cuff Size: Large)   Pulse 54   Temp (!) 97.5 F (36.4 C) (Temporal)   Ht 6\' 1"  (1.854 m)   Wt (!) 227 lb 9.6 oz (103.2 kg)   BMI 30.03 kg/m  BP Readings from Last 3 Encounters:  02/09/20 (!) 165/84  11/08/19 (!) 145/79  09/02/18 130/72   Wt Readings from Last 3 Encounters:  02/09/20 (!) 227 lb 9.6 oz (103.2 kg)  11/08/19 226 lb (102.5 kg)  09/02/18 227 lb (103 kg)      Physical Exam Vitals reviewed.  Constitutional:      Appearance: He is well-developed.  HENT:     Head: Normocephalic and atraumatic.  Eyes:     General: No scleral icterus.    Conjunctiva/sclera: Conjunctivae normal.  Cardiovascular:  Rate and Rhythm: Normal rate and regular rhythm.  Pulmonary:     Effort: Pulmonary effort is normal.     Breath sounds: Normal breath sounds.  Abdominal:     Palpations: Abdomen is soft.  Musculoskeletal:     Right lower leg: No edema.     Left lower leg: No edema.  Skin:    General: Skin is warm and dry.  Neurological:     General: No focal deficit present.     Mental Status: He is oriented to person, place, and time.  Psychiatric:        Mood and Affect: Mood normal.        Behavior: Behavior normal.        Thought Content: Thought content normal.        Judgment: Judgment normal.   ECG reveals  only sinus bradycardia with heart rate about 48.  Unchanged from previous reading.   No results found for any visits on 02/09/20.  Assessment & Plan     1. Left shoulder pain, unspecified chronicity  - EKG 12-Lead  2. Chest pain, unspecified type Very nonspecific  3. Jaw pain Jaw and shoulder pain the patient experienced could have been an atypical anginal equivalent.  Patient and wife are both concerned so will refer to cardiology for evaluation. - Ambulatory referral to Cardiology  4. Hypertension, unspecified type Blood pressure up the last 2 readings.  Also mildly hyperglycemic.  Start Norvasc.  Return to clinic 1 month - amLODipine (NORVASC) 5 MG tablet; Take 1 tablet (5 mg total) by mouth daily.  Dispense: 30 tablet; Refill: 12   No follow-ups on file.         Obie Silos Cranford Mon, MD  [facility] [phone] (phone) [phone] (fax)  [place]

## 2020-02-08 NOTE — Telephone Encounter (Signed)
Copied from [place] [id number]. Topic: Clinical - COVID Pre-Screen >> Feb 08, 2020  8:34 AM Leward Quan A wrote: 1. To the best of your knowledge, have you been in close contact with anyone with a confirmed diagnosis of COVID 19?  no  If no - Proceed to next question; If yes - Schedule patient for a virtual visit  2. Have you had any one or more of the following: fever, chills, cough, shortness of breath or any flu-like symptoms?  no  If no - Proceed to next question; If yes - Schedule patient for a virtual visit  3. Have you been diagnosed with or have a previous diagnosis of COVID 19?  no  If no - Proceed to next question; If yes - Schedule patient for a virtual visit  4. I am going to go over a few other symptoms with you. Please let me know if you are experiencing any of the following: no  Ear, nose or throat discomfort  A sore throat  Headache  Muscle pain  Diarrhea  Loss of taste or smell  If no - Continue with scheduling process; If yes - Document in scheduling notes   Thank you for answering these questions. Please know we will ask you these questions or similar questions when you arrive for your appointment and again it's how we are keeping everyone safe. Also, to keep you safe, please use the provided hand sanitizer when you enter the building. [name], we are asking everyone in the building to wear a mask because they help [place] prevent the spread of germs.   Do you have a mask of your own, if not, we are happy to provide one for you. The last thing I want to go over with you is the no visitor guidelines. This means no one can attend the appointment with you unless you need physical assistance. I understand this may be different from your past appointments and I know this may be difficult but please know if someone is driving you we are happy to call them for you once your appointment is over.

## 2020-02-09 ENCOUNTER — Other Ambulatory Visit: Payer: Self-pay

## 2020-02-09 ENCOUNTER — Encounter: Payer: Self-pay | Admitting: Family Medicine

## 2020-02-09 ENCOUNTER — Ambulatory Visit (INDEPENDENT_AMBULATORY_CARE_PROVIDER_SITE_OTHER): Payer: Medicare HMO | Admitting: Family Medicine

## 2020-02-09 VITALS — BP 165/84 | HR 54 | Temp 97.5°F | Ht 73.0 in | Wt 227.6 lb

## 2020-02-09 DIAGNOSIS — R079 Chest pain, unspecified: Secondary | ICD-10-CM | POA: Diagnosis not present

## 2020-02-09 DIAGNOSIS — I10 Essential (primary) hypertension: Secondary | ICD-10-CM

## 2020-02-09 DIAGNOSIS — M25512 Pain in left shoulder: Secondary | ICD-10-CM | POA: Diagnosis not present

## 2020-02-09 DIAGNOSIS — R6884 Jaw pain: Secondary | ICD-10-CM | POA: Diagnosis not present

## 2020-02-09 MED ORDER — AMLODIPINE BESYLATE 5 MG PO TABS: 5.0000 mg | ORAL_TABLET | Freq: Every day | ORAL | 12 refills | Status: DC

## 2020-02-10 ENCOUNTER — Ambulatory Visit: Payer: Medicare HMO | Admitting: Cardiology

## 2020-02-10 ENCOUNTER — Other Ambulatory Visit: Payer: Self-pay

## 2020-02-10 ENCOUNTER — Encounter: Payer: Self-pay | Admitting: Cardiology

## 2020-02-10 VITALS — BP 144/100 | HR 61 | Ht 73.0 in | Wt 224.5 lb

## 2020-02-10 DIAGNOSIS — R079 Chest pain, unspecified: Secondary | ICD-10-CM

## 2020-02-10 DIAGNOSIS — R072 Precordial pain: Secondary | ICD-10-CM | POA: Diagnosis not present

## 2020-02-10 DIAGNOSIS — E7800 Pure hypercholesterolemia, unspecified: Secondary | ICD-10-CM | POA: Diagnosis not present

## 2020-02-10 DIAGNOSIS — I10 Essential (primary) hypertension: Secondary | ICD-10-CM | POA: Diagnosis not present

## 2020-02-10 MED ORDER — METOPROLOL TARTRATE 100 MG PO TABS
100.0000 mg | ORAL_TABLET | Freq: Once | ORAL | 0 refills | Status: DC
Start: 2020-02-10 — End: 2020-04-25

## 2020-02-10 MED ORDER — ATORVASTATIN CALCIUM 40 MG PO TABS
40.0000 mg | ORAL_TABLET | Freq: Every day | ORAL | 5 refills | Status: DC
Start: 2020-02-10 — End: 2020-07-27

## 2020-02-10 NOTE — Progress Notes (Signed)
Cardiology Office Note:    Date:  02/10/2020   ID:  [name], DOB Dec 11, 1950, MRN [id number]  PCP:  Jerrol Banana., MD  [facility] Cardiologist:  Kate Sable, MD  [place] Electrophysiologist:  None   Referring MD: Jerrol Banana.,*   Chief Complaint  Patient presents with  . New Patient (Initial Visit)    Referred by Dr. Shawn Stall reports elevated blood pressure readings and one episode of jaw pain radiating to left shoulder and left side last week; Meds verbally reviewed with patient.    History of Present Illness:    Steven Russell is a 69 y.o. male with a hx of hypertension, GERD who presents due to chest pain and jaw pain.  Patient states having symptoms of left-sided jaw pain radiating to left shoulder and back while walking in [place] last week.  Symptoms were persistent for a day or 2.  Over the past week, patient has had nonspecific chest discomfort which he describes as dull, 2 out of 10 in severity, lasting for 30 minutes to an hour.  Symptoms are not related with exertion.  He has a history of hypertension, started on amlodipine yesterday, he is yet to pick up his BP medication.  He denies any history of heart disease.  He smokes sparingly over 40 years ago for about 5 years.   Past Medical History:  Diagnosis Date  . Depression   . GERD (gastroesophageal reflux disease)     Past Surgical History:  Procedure Laterality Date  . COLONOSCOPY WITH PROPOFOL N/A 07/12/2015   Procedure: COLONOSCOPY WITH PROPOFOL;  Surgeon: Manya Silvas, MD;  Location: [facility] ENDOSCOPY;  Service: Endoscopy;  Laterality: N/A;  . HERNIA REPAIR    . TONSILLECTOMY    . UPPER GI ENDOSCOPY  10/05/06   normal duodenum, reflux esophagitis and hiatus hernia  . VASECTOMY      Current Medications: Current Meds  Medication Sig  . amLODipine (NORVASC) 5 MG tablet Take 1 tablet (5 mg total) by mouth daily.  Marland Kitchen aspirin 81 MG tablet Take 81 mg by mouth daily.     Marland Kitchen esomeprazole (NEXIUM) 40 MG capsule Take 40 mg by mouth daily at 12 noon.   . sertraline (ZOLOFT) 50 MG tablet Take 1 1/2 tablets daily. (Patient taking differently: Take 75 mg by mouth daily. Take 1 1/2 tablets daily.)     Allergies:   Patient has no known allergies.   Social History   Socioeconomic History  . Marital status: Married    Spouse name: Not on file  . Number of children: 2  . Years of education: Not on file  . Highest education level: Bachelor's degree (e.g., BA, AB, BS)  Occupational History  . Occupation: retired  Tobacco Use  . Smoking status: Former Research scientist (life sciences)  . Smokeless tobacco: Never Used  . Tobacco comment: quit 35 years ago  Vaping Use  . Vaping Use: Never used  Substance and Sexual Activity  . Alcohol use: Yes    Comment: wine or beer a glass EOD  . Drug use: No  . Sexual activity: Not on file  Other Topics Concern  . Not on file  Social History Narrative  . Not on file   Social Determinants of Health   Financial Resource Strain: Low Risk   . Difficulty of Paying Living Expenses: Not hard at all  Food Insecurity: No Food Insecurity  . Worried About Charity fundraiser in the Last Year: Never  true  . Ran Out of Food in the Last Year: Never true  Transportation Needs: No Transportation Needs  . Lack of Transportation (Medical): No  . Lack of Transportation (Non-Medical): No  Physical Activity: Inactive  . Days of Exercise per Week: 0 days  . Minutes of Exercise per Session: 0 min  Stress: No Stress Concern Present  . Feeling of Stress : Only a little  Social Connections: Moderately Integrated  . Frequency of Communication with Friends and Family: More than three times a week  . Frequency of Social Gatherings with Friends and Family: More than three times a week  . Attends Religious Services: More than 4 times per year  . Active Member of Clubs or Organizations: No  . Attends Archivist Meetings: Never  . Marital Status: Married      Family History: The patient's family history includes Brain cancer in his paternal grandfather; Breast cancer in his mother; Diabetes in his daughter and sister; Glaucoma in his mother; Lung cancer in his father; Melanoma in his sister.  ROS:   Please see the history of present illness.     All other systems reviewed and are negative.  EKGs/Labs/Other Studies Reviewed:    The following studies were reviewed today:   EKG:  EKG is  ordered today.  The ekg ordered today demonstrates normal sinus rhythm, left axis deviation  Recent Labs: 11/09/2019: ALT 9; BUN 22; Creatinine, Ser 1.12; Hemoglobin 13.3; Platelets 144; Potassium 4.4; Sodium 139; TSH 4.040  Recent Lipid Panel    Component Value Date/Time   CHOL 174 11/09/2019 0814   TRIG 127 11/09/2019 0814   HDL 34 (L) 11/09/2019 0814   CHOLHDL 5.1 (H) 11/09/2019 0814   LDLCALC 117 (H) 11/09/2019 1761    Physical Exam:    VS:  BP (!) 144/100 (BP Location: Right Arm, Patient Position: Sitting, Cuff Size: Normal)   Pulse 61   Ht '6\' 1"'$  (1.854 m)   Wt (!) 224 lb 8 oz (101.8 kg)   SpO2 97%   BMI 29.62 kg/m     Wt Readings from Last 3 Encounters:  02/10/20 (!) 224 lb 8 oz (101.8 kg)  02/09/20 (!) 227 lb 9.6 oz (103.2 kg)  11/08/19 226 lb (102.5 kg)     GEN:  Well nourished, well developed in no acute distress HEENT: Normal NECK: No JVD; No carotid bruits LYMPHATICS: No lymphadenopathy CARDIAC: RRR, no murmurs, rubs, gallops RESPIRATORY:  Clear to auscultation without rales, wheezing or rhonchi  ABDOMEN: Soft, non-tender, non-distended MUSCULOSKELETAL:  No edema; No deformity  SKIN: Warm and dry NEUROLOGIC:  Alert and oriented x 3 PSYCHIATRIC:  Normal affect   ASSESSMENT:    1. Chest pain of uncertain etiology   2. Essential hypertension   3. Pure hypercholesterolemia   4. Precordial pain    PLAN:    In order of problems listed above:  1. Patient with symptoms of chest discomfort concerning for angina.  He has  risk factors of age, hypertension, hyperlipidemia.  Get echocardiogram to evaluate cardiac function, get coronary CTA to evaluate presence of CAD. 2. History of hypertension, BP is elevated.  Recently prescribed amlodipine 5 mg daily.  Encourage patient to start taking BP meds. 3. Last cholesterol levels were elevated.  10-year ASCVD of 19.9%.  We will start Lipitor 40 mg daily.  Follow-up after echo and coronary CTA.  Total encounter time more than 60 minutes  Greater than 50% was spent in counseling and coordination of care  with the patient   This note was generated in part or whole with voice recognition software. Voice recognition is usually quite accurate but there are transcription errors that can and very often do occur. I apologize for any typographical errors that were not detected and corrected.  Medication Adjustments/Labs and Tests Ordered: Current medicines are reviewed at length with the patient today.  Concerns regarding medicines are outlined above.  Orders Placed This Encounter  Procedures  . CT CORONARY MORPH W/CTA COR W/SCORE W/CA W/CM &/OR WO/CM  . CT CORONARY FRACTIONAL FLOW RESERVE DATA PREP  . CT CORONARY FRACTIONAL FLOW RESERVE FLUID ANALYSIS  . EKG 12-Lead  . ECHOCARDIOGRAM COMPLETE   Meds ordered this encounter  Medications  . metoprolol tartrate (LOPRESSOR) 100 MG tablet    Sig: Take 1 tablet (100 mg total) by mouth once for 1 dose. Take 2 hours prior to your CT scan.    Dispense:  1 tablet    Refill:  0  . atorvastatin (LIPITOR) 40 MG tablet    Sig: Take 1 tablet (40 mg total) by mouth daily.    Dispense:  30 tablet    Refill:  5    Patient Instructions  Medication Instructions:   1.  See instructions for day of Cardiac CT.  2.  START Lipitor (Atorvastatin): Take 1 tablet (40 mg total) by mouth daily  *If you need a refill on your cardiac medications before your next appointment, please call your pharmacy*   Lab Work: None Ordered If you have  labs (blood work) drawn today and your tests are completely normal, you will receive your results only by: Marland Kitchen MyChart Message (if you have MyChart) OR . A paper copy in the mail If you have any lab test that is abnormal or we need to change your treatment, we will call you to review the results.   Testing/Procedures:  1.  Your physician has requested that you have an echocardiogram. Echocardiography is a painless test that uses sound waves to create images of your heart. It provides your doctor with information about the size and shape of your heart and how well your heart's chambers and valves are working. This procedure takes approximately one hour. There are no restrictions for this procedure.  2.  Your physician has requested that you have cardiac CT. Cardiac computed tomography (CT) is a painless test that uses an x-ray machine to take clear, detailed pictures of your heart.   Your cardiac CT will be scheduled at:  [facility] [address] [place], [place] [address] [phone]   At [facility], please arrive 15 mins early for check-in and test prep.   Please follow these instructions carefully (unless otherwise directed):   Hold all erectile dysfunction medications at least 3 days (72 hrs) prior to test.   On the Night Before the Test: . Be sure to Drink plenty of water. . Do not consume any caffeinated/decaffeinated beverages or chocolate 12 hours prior to your test. . Do not take any antihistamines 12 hours prior to your test.   On the Day of the Test: . Drink plenty of water. Do not drink any water within one hour of the test. . Do not eat any food 4 hours prior to the test. . You may take your regular medications prior to the test.  . Take metoprolol (Lopressor) two hours prior to test.        After the Test: . Drink  plenty of water. . After receiving IV contrast, you may experience a mild  flushed feeling. This is normal. . On occasion, you may experience a mild rash up to 24 hours after the test. This is not dangerous. If this occurs, you can take Benadryl 25 mg and increase your fluid intake. . If you experience trouble breathing, this can be serious. If it is severe call 911 IMMEDIATELY. If it is mild, please call our office. . If you take any of these medications: Glipizide/Metformin, Avandament, Glucavance, please do not take 48 hours after completing test unless otherwise instructed.    Once we have confirmed authorization from your insurance company, we will call you to set up a date and time for your test. Based on how quickly your insurance processes prior authorizations requests, please allow up to 4 weeks to be contacted for scheduling your Cardiac CT appointment. Be advised that routine Cardiac CT appointments could be scheduled as many as 8 weeks after your provider has ordered it.  For non-scheduling related questions, please contact the cardiac imaging nurse navigator should you have any questions/concerns: Rockwell Alexandria, Cardiac Imaging Nurse Navigator Mitzi Hansen, Interim Cardiac Imaging Nurse Navigator [facility] Direct Office Dial: [phone]   For scheduling needs, including cancellations and rescheduling, please call Sheralyn Boatman at [phone], option 3.     Follow-Up: At [facility], you and your health needs are our priority.  As part of our continuing mission to provide you with exceptional heart care, we have created designated Provider Care Teams.  These Care Teams include your primary Cardiologist (physician) and Advanced Practice Providers (APPs -  Physician Assistants and Nurse Practitioners) who all work together to provide you with the care you need, when you need it.  We recommend signing up for the patient portal called "MyChart".  Sign up information is provided on this After Visit Summary.  MyChart is used to connect with  patients for Virtual Visits (Telemedicine).  Patients are able to view lab/test results, encounter notes, upcoming appointments, etc.  Non-urgent messages can be sent to your provider as well.   To learn more about what you can do with MyChart, go to [email or web address].    Your next appointment:   Follow up after CTA and Echo   The format for your next appointment:   In Person  Provider:   Debbe Odea, MD   Other Instructions   Echocardiogram An echocardiogram is a procedure that uses painless sound waves (ultrasound) to produce an image of the heart. Images from an echocardiogram can provide important information about:  Signs of coronary artery disease (CAD).  Aneurysm detection. An aneurysm is a weak or damaged part of an artery wall that bulges out from the normal force of blood pumping through the body.  Heart size and shape. Changes in the size or shape of the heart can be associated with certain conditions, including heart failure, aneurysm, and CAD.  Heart muscle function.  Heart valve function.  Signs of a past heart attack.  Fluid buildup around the heart.  Thickening of the heart muscle.  A tumor or infectious growth around the heart valves. Tell a health care provider about:  Any allergies you have.  All medicines you are taking, including vitamins, herbs, eye drops, creams, and over-the-counter medicines.  Any blood disorders you have.  Any surgeries you have had.  Any medical conditions you have.  Whether you are pregnant or may be pregnant. What are the risks? Generally,  this is a safe procedure. However, problems may occur, including:  Allergic reaction to dye (contrast) that may be used during the procedure. What happens before the procedure? No specific preparation is needed. You may eat and drink normally. What happens during the procedure?   An IV tube may be inserted into one of your veins.  You may receive contrast through  this tube. A contrast is an injection that improves the quality of the pictures from your heart.  A gel will be applied to your chest.  A wand-like tool (transducer) will be moved over your chest. The gel will help to transmit the sound waves from the transducer.  The sound waves will harmlessly bounce off of your heart to allow the heart images to be captured in real-time motion. The images will be recorded on a computer. The procedure may vary among health care providers and hospitals. What happens after the procedure?  You may return to your normal, everyday life, including diet, activities, and medicines, unless your health care provider tells you not to do that. Summary  An echocardiogram is a procedure that uses painless sound waves (ultrasound) to produce an image of the heart.  Images from an echocardiogram can provide important information about the size and shape of your heart, heart muscle function, heart valve function, and fluid buildup around your heart.  You do not need to do anything to prepare before this procedure. You may eat and drink normally.  After the echocardiogram is completed, you may return to your normal, everyday life, unless your health care provider tells you not to do that. This information is not intended to replace advice given to you by your health care provider. Make sure you discuss any questions you have with your health care provider. Document Revised: 10/21/2018 Document Reviewed: 08/02/2016 Elsevier Patient Education  2020 [place], Kate Sable, MD  02/10/2020 12:44 PM    [place]

## 2020-02-10 NOTE — Patient Instructions (Addendum)
Medication Instructions:   1.  See instructions for day of Cardiac CT.  2.  START Lipitor (Atorvastatin): Take 1 tablet (40 mg total) by mouth daily  *If you need a refill on your cardiac medications before your next appointment, please call your pharmacy*   Lab Work: None Ordered If you have labs (blood work) drawn today and your tests are completely normal, you will receive your results only by: Marland Kitchen MyChart Message (if you have MyChart) OR . A paper copy in the mail If you have any lab test that is abnormal or we need to change your treatment, we will call you to review the results.   Testing/Procedures:  1.  Your physician has requested that you have an echocardiogram. Echocardiography is a painless test that uses sound waves to create images of your heart. It provides your doctor with information about the size and shape of your heart and how well your heart's chambers and valves are working. This procedure takes approximately one hour. There are no restrictions for this procedure.  2.  Your physician has requested that you have cardiac CT. Cardiac computed tomography (CT) is a painless test that uses an x-ray machine to take clear, detailed pictures of your heart.   Your cardiac CT will be scheduled at:  [facility] [address] [place], [place] [address] [phone]   At [facility], please arrive 15 mins early for check-in and test prep.   Please follow these instructions carefully (unless otherwise directed):   Hold all erectile dysfunction medications at least 3 days (72 hrs) prior to test.   On the Night Before the Test: . Be sure to Drink plenty of water. . Do not consume any caffeinated/decaffeinated beverages or chocolate 12 hours prior to your test. . Do not take any antihistamines 12 hours prior to your test.   On the Day of the Test: . Drink plenty of water. Do not drink any water  within one hour of the test. . Do not eat any food 4 hours prior to the test. . You may take your regular medications prior to the test.  . Take metoprolol (Lopressor) two hours prior to test.        After the Test: . Drink plenty of water. . After receiving IV contrast, you may experience a mild flushed feeling. This is normal. . On occasion, you may experience a mild rash up to 24 hours after the test. This is not dangerous. If this occurs, you can take Benadryl 25 mg and increase your fluid intake. . If you experience trouble breathing, this can be serious. If it is severe call 911 IMMEDIATELY. If it is mild, please call our office. . If you take any of these medications: Glipizide/Metformin, Avandament, Glucavance, please do not take 48 hours after completing test unless otherwise instructed.    Once we have confirmed authorization from your insurance company, we will call you to set up a date and time for your test. Based on how quickly your insurance processes prior authorizations requests, please allow up to 4 weeks to be contacted for scheduling your Cardiac CT appointment. Be advised that routine Cardiac CT appointments could be scheduled as many as 8 weeks after your provider has ordered it.  For non-scheduling related questions, please contact the cardiac imaging nurse navigator should you have any questions/concerns: Marchia Bond, Cardiac Imaging Nurse Navigator Burley Saver, Interim Cardiac Imaging Nurse Navigator [facility] Direct Office Dial: [phone]   For scheduling needs, including cancellations and rescheduling, please call Vivien Rota at [phone], option 3.     Follow-Up: At [facility], you and your health needs are our priority.  As part of our continuing mission to provide you with exceptional heart care, we have created designated Provider Care Teams.  These Care Teams include your primary Cardiologist (physician) and Advanced Practice  Providers (APPs -  Physician Assistants and Nurse Practitioners) who all work together to provide you with the care you need, when you need it.  We recommend signing up for the patient portal called "MyChart".  Sign up information is provided on this After Visit Summary.  MyChart is used to connect with patients for Virtual Visits (Telemedicine).  Patients are able to view lab/test results, encounter notes, upcoming appointments, etc.  Non-urgent messages can be sent to your provider as well.   To learn more about what you can do with MyChart, go to [email or web address].    Your next appointment:   Follow up after CTA and Echo   The format for your next appointment:   In Person  Provider:   Kate Sable, MD   Other Instructions   Echocardiogram An echocardiogram is a procedure that uses painless sound waves (ultrasound) to produce an image of the heart. Images from an echocardiogram can provide important information about:  Signs of coronary artery disease (CAD).  Aneurysm detection. An aneurysm is a weak or damaged part of an artery wall that bulges out from the normal force of blood pumping through the body.  Heart size and shape. Changes in the size or shape of the heart can be associated with certain conditions, including heart failure, aneurysm, and CAD.  Heart muscle function.  Heart valve function.  Signs of a past heart attack.  Fluid buildup around the heart.  Thickening of the heart muscle.  A tumor or infectious growth around the heart valves. Tell a health care provider about:  Any allergies you have.  All medicines you are taking, including vitamins, herbs, eye drops, creams, and over-the-counter medicines.  Any blood disorders you have.  Any surgeries you have had.  Any medical conditions you have.  Whether you are pregnant or may be pregnant. What are the risks? Generally, this is a safe procedure. However, problems may occur,  including:  Allergic reaction to dye (contrast) that may be used during the procedure. What happens before the procedure? No specific preparation is needed. You may eat and drink normally. What happens during the procedure?   An IV tube may be inserted into one of your veins.  You may receive contrast through this tube. A contrast is an injection that improves the quality of the pictures from your heart.  A gel will be applied to your chest.  A wand-like tool (transducer) will be moved over your chest. The gel will help to transmit the sound waves from the transducer.  The sound waves will harmlessly bounce off of your heart to allow the heart images to be captured in real-time motion. The images will be recorded on a computer. The procedure may vary among health care providers and hospitals. What happens after the procedure?  You may return to your normal, everyday life, including diet, activities, and medicines, unless your health care provider tells you not to do that. Summary  An echocardiogram is a procedure that uses painless sound waves (ultrasound) to produce an image of the heart.  Images from an echocardiogram  can provide important information about the size and shape of your heart, heart muscle function, heart valve function, and fluid buildup around your heart.  You do not need to do anything to prepare before this procedure. You may eat and drink normally.  After the echocardiogram is completed, you may return to your normal, everyday life, unless your health care provider tells you not to do that. This information is not intended to replace advice given to you by your health care provider. Make sure you discuss any questions you have with your health care provider. Document Revised: 10/21/2018 Document Reviewed: 08/02/2016 Elsevier Patient Education  [place].

## 2020-02-14 ENCOUNTER — Ambulatory Visit (INDEPENDENT_AMBULATORY_CARE_PROVIDER_SITE_OTHER): Payer: Medicare HMO

## 2020-02-14 ENCOUNTER — Other Ambulatory Visit: Payer: Self-pay

## 2020-02-14 DIAGNOSIS — R072 Precordial pain: Secondary | ICD-10-CM

## 2020-02-14 DIAGNOSIS — R079 Chest pain, unspecified: Secondary | ICD-10-CM

## 2020-02-14 DIAGNOSIS — I10 Essential (primary) hypertension: Secondary | ICD-10-CM | POA: Diagnosis not present

## 2020-02-14 LAB — ECHOCARDIOGRAM COMPLETE
AR max vel: 2.39 cm2
AV Area VTI: 2.38 cm2
AV Area mean vel: 2.48 cm2
AV Mean grad: 7 mmHg
AV Peak grad: 14.9 mmHg
Ao pk vel: 1.93 m/s
Area-P 1/2: 1.99 cm2
Calc EF: 53.4 %
S' Lateral: 3 cm
Single Plane A2C EF: 53.2 %
Single Plane A4C EF: 52.1 %

## 2020-02-28 ENCOUNTER — Telehealth: Payer: Self-pay

## 2020-02-28 DIAGNOSIS — Z01812 Encounter for preprocedural laboratory examination: Secondary | ICD-10-CM

## 2020-02-28 NOTE — Telephone Encounter (Signed)
Spoke to patient and informed him of the need to get a BMET drawn prior to his CTA. Patient verbalized understanding and agreed with plan.

## 2020-03-01 ENCOUNTER — Other Ambulatory Visit
Admission: RE | Admit: 2020-03-01 | Discharge: 2020-03-01 | Disposition: A | Discharge status: Home / Self Care | Payer: Medicare HMO | Attending: Cardiology | Admitting: Cardiology

## 2020-03-01 DIAGNOSIS — Z01812 Encounter for preprocedural laboratory examination: Secondary | ICD-10-CM | POA: Insufficient documentation

## 2020-03-01 LAB — BASIC METABOLIC PANEL
Anion gap: 9 (ref 5–15)
BUN: 23 mg/dL (ref 8–23)
CO2: 26 mmol/L (ref 22–32)
Calcium: 9.2 mg/dL (ref 8.9–10.3)
Chloride: 106 mmol/L (ref 98–111)
Creatinine, Ser: 1.1 mg/dL (ref 0.61–1.24)
GFR calc Af Amer: >60 mL/min (ref >60)
GFR calc non Af Amer: >60 mL/min (ref >60)
Glucose, Bld: 131 mg/dL — ABNORMAL HIGH (ref 70–99)
Potassium: 4.2 mmol/L (ref 3.5–5.1)
Sodium: 141 mmol/L (ref 135–145)

## 2020-03-06 ENCOUNTER — Telehealth (HOSPITAL_COMMUNITY): Payer: Self-pay | Admitting: Emergency Medicine

## 2020-03-06 NOTE — Telephone Encounter (Signed)
Reaching out to patient to offer assistance regarding upcoming cardiac imaging study; pt verbalizes understanding of appt date/time, parking situation and where to check in, pre-test NPO status and medications ordered, and verified current allergies; name and call back number provided for further questions should they arise Khrystina Bonnes RN Navigator Cardiac Imaging [place] [facility] [phone] office [phone] cell 

## 2020-03-08 ENCOUNTER — Ambulatory Visit
Admission: RE | Admit: 2020-03-08 | Discharge: 2020-03-08 | Disposition: A | Discharge status: Home / Self Care | Payer: Medicare HMO | Source: Ambulatory Visit | Attending: Cardiology | Admitting: Cardiology

## 2020-03-08 ENCOUNTER — Other Ambulatory Visit: Payer: Self-pay

## 2020-03-08 DIAGNOSIS — R072 Precordial pain: Secondary | ICD-10-CM | POA: Insufficient documentation

## 2020-03-08 HISTORY — DX: Essential (primary) hypertension: I10

## 2020-03-08 MED ORDER — NITROGLYCERIN 0.4 MG SL SUBL
0.8000 mg | SUBLINGUAL_TABLET | Freq: Once | SUBLINGUAL | Status: AC
  Administered 2020-03-08: 0.8 mg via SUBLINGUAL

## 2020-03-08 MED ORDER — IOHEXOL 350 MG/ML SOLN
100.0000 mL | Freq: Once | INTRAVENOUS | Status: AC | PRN
  Administered 2020-03-08: 75 mL via INTRAVENOUS

## 2020-03-08 NOTE — Progress Notes (Signed)
Patient tolerated procedure well. Ambulate w/o difficulty. Sitting in chair drinking. No needs. All questions answered. ABC intact. Discharge from procedure area w/o issues.

## 2020-03-09 NOTE — Progress Notes (Deleted)
     Established patient visit   Patient: Steven Russell   DOB: 07-29-1950   69 y.o. Male  MRN: [id number] Visit Date: 03/15/2020  Today's healthcare provider: Wilhemena Durie, MD   No chief complaint on file.  Subjective    HPI  ***  {Show patient history (optional):23778::" "}   Medications: Outpatient Medications Prior to Visit  Medication Sig  . amLODipine (NORVASC) 5 MG tablet Take 1 tablet (5 mg total) by mouth daily.  Marland Kitchen aspirin 81 MG tablet Take 81 mg by mouth daily.   Marland Kitchen atorvastatin (LIPITOR) 40 MG tablet Take 1 tablet (40 mg total) by mouth daily.  Marland Kitchen dexlansoprazole (DEXILANT) 60 MG capsule Take 1 capsule (60 mg total) by mouth daily. (Patient not taking: Reported on 10/25/2019)  . esomeprazole (NEXIUM) 40 MG capsule Take 40 mg by mouth daily at 12 noon.   . metoprolol tartrate (LOPRESSOR) 100 MG tablet Take 1 tablet (100 mg total) by mouth once for 1 dose. Take 2 hours prior to your CT scan.  . sertraline (ZOLOFT) 50 MG tablet Take 1 1/2 tablets daily. (Patient taking differently: Take 75 mg by mouth daily. Take 1 1/2 tablets daily.)   No facility-administered medications prior to visit.    Review of Systems  Constitutional: Negative for appetite change, chills and fever.  Respiratory: Negative for chest tightness, shortness of breath and wheezing.   Cardiovascular: Negative for chest pain and palpitations.  Gastrointestinal: Negative for abdominal pain, nausea and vomiting.    {Heme  Chem  Endocrine  Serology  Results Review (optional):23779::" "}  Objective    There were no vitals taken for this visit. {Show previous vital signs (optional):23777::" "}  Physical Exam  ***  No results found for any visits on 03/15/20.  Assessment & Plan     ***  No follow-ups on file.      {provider attestation***:1}   Wilhemena Durie, MD  [facility] [phone] (phone) [phone] (fax)  [place]

## 2020-03-15 ENCOUNTER — Ambulatory Visit: Payer: Self-pay | Admitting: Family Medicine

## 2020-03-19 DIAGNOSIS — M5417 Radiculopathy, lumbosacral region: Secondary | ICD-10-CM | POA: Diagnosis not present

## 2020-03-19 DIAGNOSIS — M545 Low back pain: Secondary | ICD-10-CM | POA: Diagnosis not present

## 2020-03-23 ENCOUNTER — Telehealth: Payer: Self-pay

## 2020-03-23 ENCOUNTER — Ambulatory Visit
Admission: EM | Admit: 2020-03-23 | Discharge: 2020-03-23 | Disposition: A | Discharge status: Home / Self Care | Payer: Medicare HMO | Attending: Emergency Medicine | Admitting: Emergency Medicine

## 2020-03-23 DIAGNOSIS — M5442 Lumbago with sciatica, left side: Secondary | ICD-10-CM

## 2020-03-23 DIAGNOSIS — M4716 Other spondylosis with myelopathy, lumbar region: Secondary | ICD-10-CM | POA: Diagnosis not present

## 2020-03-23 MED ORDER — METHOCARBAMOL 500 MG PO TABS: 500.0000 mg | ORAL_TABLET | Freq: Two times a day (BID) | ORAL | 0 refills | Status: DC | PRN

## 2020-03-23 NOTE — ED Triage Notes (Signed)
Patient complains of lower back pain x1 week. Reports he has been doing some physical therapy exercises which has been helping but reports this morning nothing he could do helped with the pain. He has been taking OTC motrin and tylenol with minimal relief of pain.

## 2020-03-23 NOTE — Telephone Encounter (Signed)
Copied from [place] [id number]. Topic: Appointment Scheduling - Scheduling Inquiry for Clinic >> Mar 23, 2020  9:31 AM Greggory Keen D wrote: Reason for CRM: Pt's wife called asking for pt to be seen.  He has hurt his back and has lower back pain.  There is nothing available today  CB#  [phone]

## 2020-03-23 NOTE — Discharge Instructions (Signed)
Take ibuprofen as needed for your pain.  Take the muscle relaxer as needed for muscle spasm; Do not drive, operate machinery, or drink alcohol with this medication as it may make you drowsy.    Follow up with your primary care provider or an orthopedist if your pain is not improving.       

## 2020-03-23 NOTE — Telephone Encounter (Signed)
Spoke with patients wife who stated that she is taking patient to urgent care to be evaluated. KW

## 2020-03-23 NOTE — ED Provider Notes (Signed)
Steven Russell    CSN: [id number] Arrival date & time: 03/23/20  1024      History   Chief Complaint Chief Complaint  Patient presents with   Back Pain    HPI Steven Russell is a 69 y.o. male.   Patient presents with left lower back pain radiating down his left posterior leg x1 week; worse since yesterday.  His pain started after he went waterskiing.  Treatment attempted at home with ibuprofen and Tylenol; he also has seen physical therapy twice this week.  He reports intermittent numbness in his left toes; currently no numbness.  He denies lower extremity weakness, loss of control of his bowel or bladder, or other symptoms.  The history is provided by the patient.    Past Medical History:  Diagnosis Date   Depression    GERD (gastroesophageal reflux disease)    Hypertension     Patient Active Problem List   Diagnosis Date Noted   Allergic rhinitis 06/14/2015   Basal cell carcinoma of skin 06/14/2015   Clinical depression 06/14/2015   DD (diverticular disease) 06/14/2015   Anxiety, generalized 06/14/2015   Acid reflux 06/14/2015   Hypercholesteremia 06/14/2015   Cannot sleep 06/14/2015   Arthritis, degenerative 06/14/2015   Avitaminosis D 06/14/2015    Past Surgical History:  Procedure Laterality Date   COLONOSCOPY WITH PROPOFOL N/A 07/12/2015   Procedure: COLONOSCOPY WITH PROPOFOL;  Surgeon: Manya Silvas, MD;  Location: [place];  Service: Endoscopy;  Laterality: N/A;   HERNIA REPAIR     TONSILLECTOMY     UPPER GI ENDOSCOPY  10/05/06   normal duodenum, reflux esophagitis and hiatus hernia   VASECTOMY         Home Medications    Prior to Admission medications   Medication Sig Start Date End Date Taking? Authorizing Provider  amLODipine (NORVASC) 5 MG tablet Take 1 tablet (5 mg total) by mouth daily. 02/09/20   Jerrol Banana., MD  aspirin 81 MG tablet Take 81 mg by mouth daily.  05/26/11   [provider]   atorvastatin (LIPITOR) 40 MG tablet Take 1 tablet (40 mg total) by mouth daily. 02/10/20 05/10/20  Kate Sable, MD  dexlansoprazole (DEXILANT) 60 MG capsule Take 1 capsule (60 mg total) by mouth daily. Patient not taking: Reported on 10/25/2019 09/02/18   Jerrol Banana., MD  esomeprazole (NEXIUM) 40 MG capsule Take 40 mg by mouth daily at 12 noon.  06/15/13   [provider]  methocarbamol (ROBAXIN) 500 MG tablet Take 1 tablet (500 mg total) by mouth 2 (two) times daily as needed for muscle spasms. 03/23/20   Sharion Balloon, NP  metoprolol tartrate (LOPRESSOR) 100 MG tablet Take 1 tablet (100 mg total) by mouth once for 1 dose. Take 2 hours prior to your CT scan. 02/10/20 02/10/20  Kate Sable, MD  sertraline (ZOLOFT) 50 MG tablet Take 1 1/2 tablets daily. Patient taking differently: Take 75 mg by mouth daily. Take 1 1/2 tablets daily. 11/08/19   Jerrol Banana., MD    Family History Family History  Problem Relation Age of Onset   Breast cancer Mother    Glaucoma Mother    Lung cancer Father    Diabetes Sister    Diabetes Daughter    Melanoma Sister    Brain cancer Paternal Grandfather     Social History Social History   Tobacco Use   Smoking status: Former Smoker   Smokeless tobacco: Never  Used   Tobacco comment: quit 35 years ago  Vaping Use   Vaping Use: Never used  Substance Use Topics   Alcohol use: Yes    Comment: wine or beer a glass EOD   Drug use: No     Allergies   Patient has no known allergies.   Review of Systems Review of Systems  Constitutional: Negative for chills and fever.  HENT: Negative for ear pain and sore throat.   Eyes: Negative for pain and visual disturbance.  Respiratory: Negative for cough and shortness of breath.   Cardiovascular: Negative for chest pain and palpitations.  Gastrointestinal: Negative for abdominal pain and vomiting.  Genitourinary: Negative for dysuria and hematuria.    Musculoskeletal: Positive for back pain. Negative for arthralgias.  Skin: Negative for color change and rash.  Neurological: Positive for numbness. Negative for seizures, syncope and weakness.  All other systems reviewed and are negative.    Physical Exam Triage Vital Signs ED Triage Vitals  Enc Vitals Group     BP      Pulse      Resp      Temp      Temp src      SpO2      Weight      Height      Head Circumference      Peak Flow      Pain Score      Pain Loc      Pain Edu?      Excl. in [place]?    No data found.  Updated Vital Signs BP (!) 155/93    Pulse 64    Temp 98.6 F (37 C)    Resp 16    SpO2 96%   Visual Acuity Right Eye Distance:   Left Eye Distance:   Bilateral Distance:    Right Eye Near:   Left Eye Near:    Bilateral Near:     Physical Exam Vitals and nursing note reviewed.  Constitutional:      General: He is not in acute distress.    Appearance: He is well-developed.  HENT:     Head: Normocephalic and atraumatic.     Mouth/Throat:     Mouth: Mucous membranes are moist.  Eyes:     Conjunctiva/sclera: Conjunctivae normal.  Cardiovascular:     Rate and Rhythm: Normal rate and regular rhythm.     Heart sounds: No murmur heard.   Pulmonary:     Effort: Pulmonary effort is normal. No respiratory distress.     Breath sounds: Normal breath sounds.  Abdominal:     Palpations: Abdomen is soft.     Tenderness: There is no abdominal tenderness. There is no guarding or rebound.  Musculoskeletal:        General: No swelling, tenderness, deformity or signs of injury. Normal range of motion.     Cervical back: Neck supple.  Skin:    General: Skin is warm and dry.     Capillary Refill: Capillary refill takes less than 2 seconds.     Findings: No bruising, erythema, lesion or rash.  Neurological:     General: No focal deficit present.     Mental Status: He is alert and oriented to person, place, and time.     Sensory: No sensory deficit.     Motor:  No weakness.     Coordination: Coordination normal.     Gait: Gait abnormal.  Psychiatric:  Mood and Affect: Mood normal.        Behavior: Behavior normal.      UC Treatments / Results  Labs (all labs ordered are listed, but only abnormal results are displayed) Labs Reviewed - No data to display  EKG   Radiology No results found.  Procedures Procedures (including critical care time)  Medications Ordered in UC Medications - No data to display  Initial Impression / Assessment and Plan / UC Course  I have reviewed the triage vital signs and the nursing notes.  Pertinent labs & imaging results that were available during my care of the patient were reviewed by me and considered in my medical decision making (see chart for details).   Acute left lower back pain with left sciatica.  Instructed patient to continue ibuprofen as needed for discomfort.  Also treating with Robaxin; precautions for drowsiness with this medication discussed.  Instructed patient to follow-up with his PCP or an orthopedist if his pain persists or worsens.  Patient agrees to plan of care.   Final Clinical Impressions(s) / UC Diagnoses   Final diagnoses:  Acute left-sided low back pain with left-sided sciatica     Discharge Instructions     Take ibuprofen as needed for your pain.  Take the muscle relaxer as needed for muscle spasm; Do not drive, operate machinery, or drink alcohol with this medication as it may make you drowsy.    Follow up with your primary care provider or an orthopedist if your pain is not improving.          ED Prescriptions    Medication Sig Dispense Auth. Provider   methocarbamol (ROBAXIN) 500 MG tablet Take 1 tablet (500 mg total) by mouth 2 (two) times daily as needed for muscle spasms. 20 tablet Sharion Balloon, NP     PDMP not reviewed this encounter.   Sharion Balloon, NP 03/23/20 1108

## 2020-03-26 DIAGNOSIS — M545 Low back pain, unspecified: Secondary | ICD-10-CM | POA: Insufficient documentation

## 2020-03-27 ENCOUNTER — Telehealth (INDEPENDENT_AMBULATORY_CARE_PROVIDER_SITE_OTHER): Payer: Medicare HMO | Admitting: Cardiology

## 2020-03-27 ENCOUNTER — Encounter: Payer: Self-pay | Admitting: Cardiology

## 2020-03-27 ENCOUNTER — Telehealth: Payer: Self-pay | Admitting: Cardiology

## 2020-03-27 VITALS — Ht 73.0 in | Wt 225.0 lb

## 2020-03-27 DIAGNOSIS — I10 Essential (primary) hypertension: Secondary | ICD-10-CM

## 2020-03-27 DIAGNOSIS — E7800 Pure hypercholesterolemia, unspecified: Secondary | ICD-10-CM | POA: Diagnosis not present

## 2020-03-27 DIAGNOSIS — R079 Chest pain, unspecified: Secondary | ICD-10-CM

## 2020-03-27 NOTE — Patient Instructions (Signed)
Medication Instructions:  Your physician recommends that you continue on your current medications as directed. Please refer to the Current Medication list given to you today.  *If you need a refill on your cardiac medications before your next appointment, please call your pharmacy*   Lab Work: None Ordered If you have labs (blood work) drawn today and your tests are completely normal, you will receive your results only by: Marland Kitchen MyChart Message (if you have MyChart) OR . A paper copy in the mail If you have any lab test that is abnormal or we need to change your treatment, we will call you to review the results.   Testing/Procedures: None Ordered   Follow-Up: At [facility], you and your health needs are our priority.  As part of our continuing mission to provide you with exceptional heart care, we have created designated Provider Care Teams.  These Care Teams include your primary Cardiologist (physician) and Advanced Practice Providers (APPs -  Physician Assistants and Nurse Practitioners) who all work together to provide you with the care you need, when you need it.  We recommend signing up for the patient portal called "MyChart".  Sign up information is provided on this After Visit Summary.  MyChart is used to connect with patients for Virtual Visits (Telemedicine).  Patients are able to view lab/test results, encounter notes, upcoming appointments, etc.  Non-urgent messages can be sent to your provider as well.   To learn more about what you can do with MyChart, go to [email or web address].    Your next appointment:   7 month(s)  The format for your next appointment:   In Person  Provider:   Kate Sable, MD   Other Instructions

## 2020-03-27 NOTE — Telephone Encounter (Signed)
°  Patient Consent for Virtual Visit         [name] has provided verbal consent on 03/27/2020 for a virtual visit (video or telephone).   CONSENT FOR VIRTUAL VISIT FOR:  Steven Russell  By participating in this virtual visit I agree to the following:  I hereby voluntarily request, consent and authorize [facility] and its employed or contracted physicians, physician assistants, nurse practitioners or other licensed health care professionals (the Practitioner), to provide me with telemedicine health care services (the Services") as deemed necessary by the treating Practitioner. I acknowledge and consent to receive the Services by the Practitioner via telemedicine. I understand that the telemedicine visit will involve communicating with the Practitioner through live audiovisual communication technology and the disclosure of certain medical information by electronic transmission. I acknowledge that I have been given the opportunity to request an in-person assessment or other available alternative prior to the telemedicine visit and am voluntarily participating in the telemedicine visit.  I understand that I have the right to withhold or withdraw my consent to the use of telemedicine in the course of my care at any time, without affecting my right to future care or treatment, and that the Practitioner or I may terminate the telemedicine visit at any time. I understand that I have the right to inspect all information obtained and/or recorded in the course of the telemedicine visit and may receive copies of available information for a reasonable fee.  I understand that some of the potential risks of receiving the Services via telemedicine include:   Delay or interruption in medical evaluation due to technological equipment failure or disruption;  Information transmitted may not be sufficient (e.g. poor resolution of images) to allow for appropriate medical decision making by the Practitioner;  and/or   In rare instances, security protocols could fail, causing a breach of personal health information.  Furthermore, I acknowledge that it is my responsibility to provide information about my medical history, conditions and care that is complete and accurate to the best of my ability. I acknowledge that Practitioner's advice, recommendations, and/or decision may be based on factors not within their control, such as incomplete or inaccurate data provided by me or distortions of diagnostic images or specimens that may result from electronic transmissions. I understand that the practice of medicine is not an exact science and that Practitioner makes no warranties or guarantees regarding treatment outcomes. I acknowledge that a copy of this consent can be made available to me via my patient portal ([facility]), or I can request a printed copy by calling the office of [facility].    I understand that my insurance will be billed for this visit.   I have read or had this consent read to me.  I understand the contents of this consent, which adequately explains the benefits and risks of the Services being provided via telemedicine.   I have been provided ample opportunity to ask questions regarding this consent and the Services and have had my questions answered to my satisfaction.  I give my informed consent for the services to be provided through the use of telemedicine in my medical care

## 2020-03-27 NOTE — Progress Notes (Signed)
Virtual Visit via Telephone Note   This visit type was conducted due to national recommendations for restrictions regarding the COVID-19 Pandemic (e.g. social distancing) in an effort to limit this patient's exposure and mitigate transmission in our community.  Due to his co-morbid illnesses, this patient is at least at moderate risk for complications without adequate follow up.  This format is felt to be most appropriate for this patient at this time.  The patient did not have access to video technology/had technical difficulties with video requiring transitioning to audio format only (telephone).  All issues noted in this document were discussed and addressed.  No physical exam could be performed with this format.  Please refer to the patient's chart for his  consent to telehealth for [facility].   Date:  03/27/2020   ID:  Steven Russell, DOB February 06, 1951, MRN [id number]  Patient Location: Home Provider Location: Office/Clinic  PCP:  Jerrol Banana., MD  Cardiologist:  Kate Sable, MD  Electrophysiologist:  None   Evaluation Performed:  Follow-Up Visit  Chief Complaint: Follow-up visit  History of Present Illness:    Steven Russell is a 69 y.o. male with history of hypertension, hyperlipidemia, GERD presents today for follow-up.  Patient previously seen for chest pain concerning for angina.  Due to risk factors and symptoms, coronary CTA and echocardiogram was ordered to evaluate cardiac function.  Patient's cholesterol was also elevated, Lipitor 40 mg daily started.  Has not had any more symptoms of chest pain.  He is tolerating Lipitor without any side effects.  He also started taking amlodipine as recommended after last visit.  Does not check his blood pressure at home.  Otherwise feels fine.  Has an annual physical with primary care provider in about 6 months.  He always gets lab work after his annual physical.  The patient does not have symptoms concerning for  COVID-19 infection (fever, chills, cough, or new shortness of breath).    Past Medical History:  Diagnosis Date  . Depression   . GERD (gastroesophageal reflux disease)   . Hypertension    Past Surgical History:  Procedure Laterality Date  . COLONOSCOPY WITH PROPOFOL N/A 07/12/2015   Procedure: COLONOSCOPY WITH PROPOFOL;  Surgeon: Manya Silvas, MD;  Location: [facility] ENDOSCOPY;  Service: Endoscopy;  Laterality: N/A;  . HERNIA REPAIR    . TONSILLECTOMY    . UPPER GI ENDOSCOPY  10/05/06   normal duodenum, reflux esophagitis and hiatus hernia  . VASECTOMY       Current Meds  Medication Sig  . amLODipine (NORVASC) 5 MG tablet Take 1 tablet (5 mg total) by mouth daily.  Marland Kitchen aspirin 81 MG tablet Take 81 mg by mouth daily.   Marland Kitchen atorvastatin (LIPITOR) 40 MG tablet Take 1 tablet (40 mg total) by mouth daily.  Marland Kitchen esomeprazole (NEXIUM) 40 MG capsule Take 40 mg by mouth daily at 12 noon.   Marland Kitchen oxyCODONE-acetaminophen (PERCOCET) 10-325 MG tablet Percocet 10 mg-325 mg tablet  Take 1 tablet every 6 hours by oral route for 7 days.  Marland Kitchen sertraline (ZOLOFT) 50 MG tablet Take 1 1/2 tablets daily. (Patient taking differently: Take 75 mg by mouth daily. Take 1 1/2 tablets daily.)     Allergies:   Patient has no known allergies.   Social History   Tobacco Use  . Smoking status: Former Research scientist (life sciences)  . Smokeless tobacco: Never Used  . Tobacco comment: quit 35 years ago  Vaping Use  . Vaping Use: Never  used  Substance Use Topics  . Alcohol use: Yes    Comment: wine or beer a glass EOD  . Drug use: No     Family Hx: The patient's family history includes Brain cancer in his paternal grandfather; Breast cancer in his mother; Diabetes in his daughter and sister; Glaucoma in his mother; Lung cancer in his father; Melanoma in his sister.  ROS:   Please see the history of present illness.     All other systems reviewed and are negative.   Prior CV studies:   The following studies were reviewed  today:  Coronary CTA, echocardiogram  Labs/Other Tests and Data Reviewed:    EKG:  No ECG reviewed.  Recent Labs: 11/09/2019: ALT 9; Hemoglobin 13.3; Platelets 144; TSH 4.040 03/01/2020: BUN 23; Creatinine, Ser 1.10; Potassium 4.2; Sodium 141   Recent Lipid Panel Lab Results  Component Value Date/Time   CHOL 174 11/09/2019 08:14 AM   TRIG 127 11/09/2019 08:14 AM   HDL 34 (L) 11/09/2019 08:14 AM   CHOLHDL 5.1 (H) 11/09/2019 08:14 AM   LDLCALC 117 (H) 11/09/2019 08:14 AM    Wt Readings from Last 3 Encounters:  03/27/20 225 lb (102.1 kg)  02/10/20 (!) 224 lb 8 oz (101.8 kg)  02/09/20 (!) 227 lb 9.6 oz (103.2 kg)     Objective:    Vital Signs:  Ht 6\' 1"  (1.854 m)   Wt 225 lb (102.1 kg)   BMI 29.69 kg/m    VITAL SIGNS:  reviewed  ASSESSMENT & PLAN:    1. Patient with history of chest pain, risk factors of age, hypertension, hyperlipidemia.  Echocardiogram showed normal systolic and diastolic function, EF 55 to 60%, mild MR.  Overall no gross structural abnormalities to suggest etiology of chest pain.  Coronary CTA had a low calcium score of 8.29, no evidence of CAD.  Patient made aware of results and reassured. 2. History of hypertension, continue amlodipine as prescribed.  Patient advised to check blood pressure frequently at home and keep a log. 3. History of hyperlipidemia, continue Lipitor.  Plan for repeat fasting lipid profile in about 6 months with primary care provider.  Follow-up with [place] after lipid panel is obtained.    COVID-19 Education: The signs and symptoms of COVID-19 were discussed with the patient and how to seek care for testing (follow up with PCP or arrange E-visit).  The importance of social distancing was discussed today.  Time:   Today, I have spent 40 minutes with the patient with telehealth technology discussing the above problems.     Medication Adjustments/Labs and Tests Ordered: Current medicines are reviewed at length with the patient today.   Concerns regarding medicines are outlined above.   Tests Ordered: No orders of the defined types were placed in this encounter.   Medication Changes: No orders of the defined types were placed in this encounter.   Follow Up:  In Person in 7 month(s)  Signed, Kate Sable, MD  03/27/2020 8:19 AM    [place]

## 2020-03-28 DIAGNOSIS — M545 Low back pain: Secondary | ICD-10-CM | POA: Diagnosis not present

## 2020-03-29 DIAGNOSIS — M545 Low back pain: Secondary | ICD-10-CM | POA: Diagnosis not present

## 2020-04-09 ENCOUNTER — Telehealth: Payer: Self-pay | Admitting: Cardiology

## 2020-04-09 DIAGNOSIS — M5416 Radiculopathy, lumbar region: Secondary | ICD-10-CM | POA: Diagnosis not present

## 2020-04-09 DIAGNOSIS — M545 Low back pain: Secondary | ICD-10-CM | POA: Diagnosis not present

## 2020-04-09 NOTE — Telephone Encounter (Signed)
° °  [facility] Pre-operative Risk Assessment    HEARTCARE STAFF: - Please ensure there is not already an duplicate clearance open for this procedure. - Under Visit Info/Reason for Call, type in Other and utilize the format Clearance MM/DD/YY or Clearance TBD. Do not use dashes or single digits. - If request is for dental extraction, please clarify the # of teeth to be extracted.  Request for surgical clearance:  1. What type of surgery is being performed? L5-5 Discectomy     2. When is this surgery scheduled? TBD  3. What type of clearance is required (medical clearance vs. Pharmacy clearance to hold med vs. Both)? both  4. Are there any medications that need to be held prior to surgery and how long? Not listed, please advise if needed  5. Practice name and name of physician performing surgery? [facility] - Dr Rolena Infante  6. What is the office phone number? [phone]   7.   What is the office fax number? [place]  8.   Anesthesia type (None, local, MAC, general) ? Not listed    Steven Russell 04/09/2020, 4:51 PM  _________________________________________________________________   (provider comments below)

## 2020-04-10 ENCOUNTER — Telehealth: Payer: Self-pay

## 2020-04-10 NOTE — Telephone Encounter (Signed)
   Primary Cardiologist: Kate Sable, MD  Chart reviewed as part of pre-operative protocol coverage. Patient was contacted 04/10/2020 in reference to pre-operative risk assessment for pending surgery as outlined below.  Steven Russell was last seen on 03/27/20 by Dr. Garen Lah. Recently had a coronary CTA with a low calcium score of 8.29 and no evidence of CAD.  Since that day, Steven Russell has done well from a cardiac standpoint.  Therefore, based on ACC/AHA guidelines, the patient would be at acceptable risk for the planned procedure without further cardiovascular testing.   The clearance request did not specifically request to hold aspirin, but he would be ok to do so. Patient was informed of this as well.    The patient was advised that if he develops new symptoms prior to surgery to contact our office to arrange for a follow-up visit, and he verbalized understanding.  I will route this recommendation to the requesting party via Epic fax function and remove from pre-op pool. Please call with questions.  Reino Bellis, NP 04/10/2020, 2:58 PM

## 2020-04-10 NOTE — Telephone Encounter (Signed)
Copied from [place] [id number]. Topic: Quick Communication - See Telephone Encounter >> Apr 10, 2020 10:00 AM Loma Boston wrote: Pls fu with pt Surgical Clearance form left for back surgery yesterday, pt in pain wanting asap with Dr Rolena Infante [facility] cb  wife when complete [phone] [place]y Gauss)

## 2020-04-11 NOTE — Progress Notes (Signed)
Established patient visit   Patient: Steven Russell   DOB: July 04, 1951   69 y.o. Male  MRN: [id number] Visit Date: 04/12/2020  Today's healthcare provider: Wilhemena Durie, MD   Chief Complaint  Patient presents with  . Pre-op Exam   Subjective    HPI   Patient presents today in office for Pre-op visit for back surgery.  He was waterskiing about a month ago and injured himself and has had left sciatica since then.  MRI shows L5-S1 disc.  He is going to have a microdiscectomy.  He is getting weakness in his left foot and ankle He has no chest pain shortness of breath.  He was able to walk up 2 flights of stairs for this visit with no symptoms.  No neurologic symptoms.      Medications: Outpatient Medications Prior to Visit  Medication Sig  . aspirin 81 MG tablet Take 81 mg by mouth daily.   Marland Kitchen atorvastatin (LIPITOR) 40 MG tablet Take 1 tablet (40 mg total) by mouth daily.  Marland Kitchen esomeprazole (NEXIUM) 40 MG capsule Take 40 mg by mouth daily at 12 noon.   . sertraline (ZOLOFT) 50 MG tablet Take 1 1/2 tablets daily. (Patient taking differently: Take 75 mg by mouth daily. Take 1 1/2 tablets daily.)  . [DISCONTINUED] amLODipine (NORVASC) 5 MG tablet Take 1 tablet (5 mg total) by mouth daily.  . metoprolol tartrate (LOPRESSOR) 100 MG tablet Take 1 tablet (100 mg total) by mouth once for 1 dose. Take 2 hours prior to your CT scan.  . oxyCODONE-acetaminophen (PERCOCET) 10-325 MG tablet Percocet 10 mg-325 mg tablet  Take 1 tablet every 6 hours by oral route for 7 days.   No facility-administered medications prior to visit.    Review of Systems  Constitutional: Negative for appetite change, chills and fever.  Respiratory: Negative for chest tightness, shortness of breath and wheezing.   Cardiovascular: Negative for chest pain and palpitations.  Gastrointestinal: Negative for abdominal pain, nausea and vomiting.       Objective    BP (!) 170/105 (BP Location: Right Arm, Patient  Position: Sitting, Cuff Size: Large)   Pulse 73   Temp 97.9 F (36.6 C) (Oral)   Wt 221 lb 12.8 oz (100.6 kg)   BMI 29.26 kg/m  BP Readings from Last 3 Encounters:  04/12/20 (!) 155/102  03/23/20 (!) 155/93  03/08/20 128/72   Wt Readings from Last 3 Encounters:  04/12/20 221 lb 12.8 oz (100.6 kg)  03/27/20 225 lb (102.1 kg)  02/10/20 (!) 224 lb 8 oz (101.8 kg)      Physical Exam Vitals reviewed.  Constitutional:      Appearance: He is well-developed.  HENT:     Head: Normocephalic and atraumatic.  Eyes:     General: No scleral icterus.    Conjunctiva/sclera: Conjunctivae normal.  Cardiovascular:     Rate and Rhythm: Normal rate and regular rhythm.     Comments: No carotid bruit Pulmonary:     Effort: Pulmonary effort is normal.     Breath sounds: Normal breath sounds.  Abdominal:     Palpations: Abdomen is soft.  Neurological:     General: No focal deficit present.     Mental Status: He is oriented to person, place, and time.  Psychiatric:        Mood and Affect: Mood normal.        Behavior: Behavior normal.        Thought Content:  Thought content normal.        Judgment: Judgment normal.       No results found for any visits on 04/12/20.  Assessment & Plan     1. Hypertension, unspecified type Increase amlodipine to 10 mg daily.  See him back in 2 months to follow-up blood pressure - amLODipine (NORVASC) 10 MG tablet; Take 0.5 tablets (5 mg total) by mouth daily.  Dispense: 30 tablet; Refill: 12  2. Need for immunization against influenza  - Flu Vaccine QUAD High Dose(Fluad)  3. Sciatic nerve pain, left Patient cleared for surgery.  Return in about 2 months (around 06/12/2020).      I, Wilhemena Durie, MD, have reviewed all documentation for this visit. The documentation on 04/12/20 for the exam, diagnosis, procedures, and orders are all accurate and complete.    Richard Cranford Mon, MD  [facility] [phone]  (phone) [phone] (fax)  [place]

## 2020-04-11 NOTE — Telephone Encounter (Signed)
Patient has an appt scheduled at 8:00 tomorrow concerning form.

## 2020-04-12 ENCOUNTER — Other Ambulatory Visit: Payer: Self-pay

## 2020-04-12 ENCOUNTER — Encounter: Payer: Self-pay | Admitting: Family Medicine

## 2020-04-12 ENCOUNTER — Ambulatory Visit (INDEPENDENT_AMBULATORY_CARE_PROVIDER_SITE_OTHER): Payer: Medicare HMO | Admitting: Family Medicine

## 2020-04-12 VITALS — BP 155/102 | HR 66 | Temp 97.9°F | Wt 221.8 lb

## 2020-04-12 DIAGNOSIS — M5432 Sciatica, left side: Secondary | ICD-10-CM | POA: Diagnosis not present

## 2020-04-12 DIAGNOSIS — I10 Essential (primary) hypertension: Secondary | ICD-10-CM

## 2020-04-12 DIAGNOSIS — Z23 Encounter for immunization: Secondary | ICD-10-CM | POA: Diagnosis not present

## 2020-04-12 MED ORDER — AMLODIPINE BESYLATE 10 MG PO TABS: 5.0000 mg | ORAL_TABLET | Freq: Every day | ORAL | 12 refills | Status: DC

## 2020-04-16 ENCOUNTER — Ambulatory Visit: Payer: Self-pay | Admitting: Orthopedic Surgery

## 2020-04-20 ENCOUNTER — Ambulatory Visit: Payer: Self-pay | Admitting: Orthopedic Surgery

## 2020-04-20 DIAGNOSIS — M5416 Radiculopathy, lumbar region: Secondary | ICD-10-CM | POA: Diagnosis not present

## 2020-04-20 NOTE — H&P (Deleted)
  The note originally documented on this encounter has been moved the the encounter in which it belongs.  

## 2020-04-20 NOTE — Progress Notes (Signed)
[id number] [place] [address] [place] [address] Phone: [phone] Fax: [phone]      Your procedure is scheduled on Wednesday 04/25/2020.  Report to [facility] Entrance "A" at 1:00 P.M., and check in at the Admitting office.  Call this number if you have problems the morning of surgery:  [phone]  Call [phone] if you have any questions prior to your surgery date Monday-Friday 8am-4pm    Remember:  Do not eat or drink after midnight the night before your surgery    Take these medicines the morning of surgery with A SIP OF WATER: Amlodipine (Norvasc) Atorvastatin (Lipitor) Sertraline (Zoloft)  IF needed you may take: Acetaminophen (Tylenol) Tramadol (Ultram)  As of today, STOP taking any Aleve, Naproxen, Ibuprofen, Motrin, Advil, Goody's, BC's, all herbal medications, fish oil, and all vitamins.  Follow your surgeon's instructions on when to stop Aspirin.  If no instructions were given by your surgeon then you will need to call the office to get those instructions.                        Do not wear jewelry            Do not wear lotions, powders, colognes, or deodorant.            Men may shave face and neck.            Do not bring valuables to the hospital.            [facility] is not responsible for any belongings or valuables.  Do NOT Smoke (Tobacco/Vaping) or drink Alcohol 24 hours prior to your procedure  If you use a CPAP at night, you may bring all equipment for your overnight stay.   Contacts, glasses, dentures or bridgework may not be worn into surgery.      For patients admitted to the hospital, discharge time will be determined by your treatment team.   Patients discharged the day of surgery will not be allowed to drive home, and someone needs to stay with them for 24 hours.    Special instructions:   [place]- Preparing For Surgery  Before surgery, you can play an important  role. Because skin is not sterile, your skin needs to be as free of germs as possible. You can reduce the number of germs on your skin by washing with CHG (chlorahexidine gluconate) Soap before surgery.  CHG is an antiseptic cleaner which kills germs and bonds with the skin to continue killing germs even after washing.    Oral Hygiene is also important to reduce your risk of infection.  Remember - BRUSH YOUR TEETH THE MORNING OF SURGERY WITH YOUR REGULAR TOOTHPASTE  Please do not use if you have an allergy to CHG or antibacterial soaps. If your skin becomes reddened/irritated stop using the CHG.  Do not shave (including legs and underarms) for at least 48 hours prior to first CHG shower. It is OK to shave your face.  Please follow these instructions carefully.   1. Shower the NIGHT BEFORE SURGERY and the MORNING OF SURGERY with CHG Soap.   2. If you chose to wash your hair, wash your hair first as usual with your normal shampoo.  3. After you shampoo, rinse your hair and body thoroughly to remove the shampoo.  4. Use CHG as you would any other liquid soap. You can apply CHG directly to the  skin and wash gently with a scrungie or a clean washcloth.   5. Apply the CHG Soap to your body ONLY FROM THE NECK DOWN.  Do not use on open wounds or open sores. Avoid contact with your eyes, ears, mouth and genitals (private parts). Wash Face and genitals (private parts)  with your normal soap.   6. Wash thoroughly, paying special attention to the area where your surgery will be performed.  7. Thoroughly rinse your body with warm water from the neck down.  8. DO NOT shower/wash with your normal soap after using and rinsing off the CHG Soap.  9. Pat yourself dry with a CLEAN TOWEL.  10. Wear CLEAN PAJAMAS to bed the night before surgery  11. Place CLEAN SHEETS on your bed the night of your first shower and DO NOT SLEEP WITH PETS.   Day of Surgery: Shower with CHG soap as directed Wear  Clean/Comfortable clothing the morning of surgery Do not apply any deodorants/lotions.   Remember to brush your teeth WITH YOUR REGULAR TOOTHPASTE.   Please read over the following fact sheets that you were given.

## 2020-04-20 NOTE — H&P (Signed)
Subjective:   Steven Russell is a pleasant 69 yo male with a 6 week history of severe radicular left leg pain and left foot weakness. Imaging studies reveal a large left sided disc herniation at L5-S1. Despite appropriate conservative care including narcotic pain medications and his significant weakness, the patient has elected to move forward with surgical intervention. The patient is here today for a pre-operative History and Physical. They are scheduled for left Microdiscectomy L5-S1 on 04/25/2020 with Dr. Rolena Infante at [facility].  Patient Active Problem List   Diagnosis Date Noted  . Allergic rhinitis 06/14/2015  . Basal cell carcinoma of skin 06/14/2015  . Clinical depression 06/14/2015  . DD (diverticular disease) 06/14/2015  . Anxiety, generalized 06/14/2015  . Acid reflux 06/14/2015  . Hypercholesteremia 06/14/2015  . Cannot sleep 06/14/2015  . Arthritis, degenerative 06/14/2015  . Avitaminosis D 06/14/2015   Past Medical History:  Diagnosis Date  . Depression   . GERD (gastroesophageal reflux disease)   . Hypertension     Past Surgical History:  Procedure Laterality Date  . COLONOSCOPY WITH PROPOFOL N/A 07/12/2015   Procedure: COLONOSCOPY WITH PROPOFOL;  Surgeon: Manya Silvas, MD;  Location: [facility] ENDOSCOPY;  Service: Endoscopy;  Laterality: N/A;  . HERNIA REPAIR    . TONSILLECTOMY    . UPPER GI ENDOSCOPY  10/05/06   normal duodenum, reflux esophagitis and hiatus hernia  . VASECTOMY      Current Outpatient Medications  Medication Sig Dispense Refill Last Dose  . acetaminophen (TYLENOL) 500 MG tablet Take 500 mg by mouth every 6 (six) hours as needed for mild pain.     Marland Kitchen amLODipine (NORVASC) 10 MG tablet Take 0.5 tablets (5 mg total) by mouth daily. (Patient taking differently: Take 10 mg by mouth daily. ) 30 tablet 12   . aspirin 81 MG tablet Take 81 mg by mouth daily.      Marland Kitchen atorvastatin (LIPITOR) 40 MG tablet Take 1 tablet (40 mg total) by mouth daily. 30 tablet 5    . esomeprazole (NEXIUM) 20 MG capsule Take 40 mg by mouth daily at 12 noon.      . metoprolol tartrate (LOPRESSOR) 100 MG tablet Take 1 tablet (100 mg total) by mouth once for 1 dose. Take 2 hours prior to your CT scan. 1 tablet 0   . naproxen sodium (ALEVE) 220 MG tablet Take 220 mg by mouth 2 (two) times daily as needed (pain).     Marland Kitchen sertraline (ZOLOFT) 50 MG tablet Take 1 1/2 tablets daily. (Patient taking differently: Take 75 mg by mouth daily. ) 135 tablet 2   . traMADol (ULTRAM) 50 MG tablet Take 50 mg by mouth every 6 (six) hours as needed for pain.      No current facility-administered medications for this visit.   No Known Allergies  Social History   Tobacco Use  . Smoking status: Former Research scientist (life sciences)  . Smokeless tobacco: Never Used  . Tobacco comment: quit 35 years ago  Substance Use Topics  . Alcohol use: Yes    Comment: wine or beer a glass EOD    Family History  Problem Relation Age of Onset  . Breast cancer Mother   . Glaucoma Mother   . Lung cancer Father   . Diabetes Sister   . Diabetes Daughter   . Melanoma Sister   . Brain cancer Paternal Grandfather     Review of Systems As stated in HPI  Objective:   Vitals: Ht: 6 ft 04/19/2020 01:28  pm Wt: 218.5 lbs 04/19/2020 01:32 pm BMI: 29.6 04/19/2020 01:32 pm BP: 130/76 04/19/2020 01:34 pm Pulse: 69 bpm 04/19/2020 01:36 pm  Clinical exam: Steven Russell is a pleasant individual, who appears younger than their stated age. He is alert and orientated 3. No shortness of breath, chest pain.  Heart: Regular rate and rhythm, no rubs, murmurs, or gallops  Lungs: Clear to auscultation bilaterally.  Abdomen is soft and non-tender, negative loss of bowel and bladder control, no rebound tenderness. Bowel sounds 4.  Negative: skin lesions abrasions contusions  Peripheral pulses: 2+ dorsalis pedis/posterior tibialis pulses bilaterally. Compartment soft and nontender.  Gait pattern: Altered gait pattern due to left gastrocnemius  weakness.  Assistive devices: None  Neuro: 5/5 motor strength in the lower extremity bilaterally except for the left gastrocnemius. Left gastrocnemius: 3/5. Positive numbness and dysesthesias in the left S1 dermatome. Positive left straight leg raise test with reproduction of radicular leg pain. Negative Babinski test, no clonus, 1+ deep tendon reflexes at the knee and Achilles.  Musculoskeletal: Mild low back pain with range of motion and palpation. Pain is worse with forward flexion. No hip, knee, ankle pain with isolated joint range of motion  Lumbar MRI: completed on 03/28/20 was reviewed with the patient. It was completed at [facility]; I have independently reviewed the images as well as the radiology report. No fracture or abnormal marrow signal change identified. Significant degeneration and collapse of the L3-4 disc space. Mild to moderate central and foraminal stenosis at L3-4. No significant compression of the exiting L3 nerve root. Moderate facet arthropathy at L4-5. Moderate to severe foraminal stenosis. L5-S1: Large left posterior lateral disc herniation is noted with compression of the S1 traversing nerve root. No significant collapse of the disc space. No spondylolisthesis.  Assessment:   Steven Russell is a very active 69 year old gentleman is had a one-month history of significant radicular left leg pain. His clinical exam is significant for a left foot drop with 3/5 motor strength in the gastrocnemius and reproduction of pain with straight leg maneuvering. MRI confirms the left posterior lateral disc herniation with compression of the S1 nerve root. At this point time I have gone over his MRI and clinical options with the patient and his wife was available on face time. I think is chronic low back pain is coming more from the degenerative L3-4 disc but the acute decompensation is secondary to the disc herniation at L5-S1. Since his primary complaint is the radicular pain the best surgical option  at this point would be a lumbar microdiscectomy. Alternatives to surgery would be a selective nerve root block and physical therapy. I have gone over the risks and benefits of both options and all their questions were encouraged and addressed.  Plan:   Risks and benefits of decompression/discectomy: Infection, bleeding, death, stroke, paralysis, ongoing or worse pain, need for additional surgery, leak of spinal fluid, adjacent segment degeneration requiring additional surgery, post-operative hematoma formation that can result in neurological compromise and the need for urgent/emergent re-operation. Loss in bowel and bladder control. Injury to major vessels that could result in the need for urgent abdominal surgery to stop bleeding. Risk of deep venous thrombosis (DVT) and the need for additional treatment. Recurrent disc herniation resulting in the need for revision surgery, which could include fusion surgery (utilizing instrumentation such as pedicle screws and intervertebral cages).  Additional risk: If instrumentation is used there is a risk of migration, or breakage of that hardware that could require additional surgery.   Surgical  plan: Left L5-S1 microdiscectomy  We have obtained preoperative medical clearance from his primary care provider as well as his cardiologist. He will hold his aspirin 7 days prior to surgery and restart 48 hours after.  I reviewed the patient's medication list with him. He does take aspirin, but he will begin holding this 1 week prior to surgery. He is not on any blood thinners. He is using anti-inflammatory medications, but I have advised him to hold this 7 days before surgery as well and he did express understanding of this. He may take tramadol as needed for pain leading up to surgery in addition to Tylenol.  Patient has appointment scheduled with physical therapy for preoperative LSO brace fitting.  We have also discussed the post-operative recovery period to  include: bathing/showering restrictions, wound healing, activity (and driving) restrictions, medications/pain mangement.  We have also discussed post-operative redflags to include: signs and symptoms of postoperative infection, DVT/PE.  All patients questions were invited and answered  Follow-up: 2 weeks postop

## 2020-04-23 ENCOUNTER — Encounter (HOSPITAL_COMMUNITY)
Admission: RE | Admit: 2020-04-23 | Discharge: 2020-04-23 | Disposition: A | Discharge status: Home / Self Care | Payer: Medicare HMO | Source: Ambulatory Visit | Attending: Orthopedic Surgery | Admitting: Orthopedic Surgery

## 2020-04-23 ENCOUNTER — Encounter (HOSPITAL_COMMUNITY): Payer: Self-pay

## 2020-04-23 ENCOUNTER — Other Ambulatory Visit: Payer: Medicare HMO

## 2020-04-23 ENCOUNTER — Other Ambulatory Visit (HOSPITAL_COMMUNITY)
Admission: RE | Admit: 2020-04-23 | Discharge: 2020-04-23 | Disposition: A | Discharge status: Home / Self Care | Payer: Medicare HMO | Source: Ambulatory Visit | Attending: Orthopedic Surgery | Admitting: Orthopedic Surgery

## 2020-04-23 ENCOUNTER — Other Ambulatory Visit: Payer: Self-pay

## 2020-04-23 DIAGNOSIS — Z20822 Contact with and (suspected) exposure to covid-19: Secondary | ICD-10-CM | POA: Insufficient documentation

## 2020-04-23 DIAGNOSIS — Z01812 Encounter for preprocedural laboratory examination: Secondary | ICD-10-CM | POA: Insufficient documentation

## 2020-04-23 DIAGNOSIS — Z01818 Encounter for other preprocedural examination: Secondary | ICD-10-CM | POA: Diagnosis not present

## 2020-04-23 DIAGNOSIS — R21 Rash and other nonspecific skin eruption: Secondary | ICD-10-CM | POA: Diagnosis not present

## 2020-04-23 HISTORY — DX: Sleep apnea, unspecified: G47.30

## 2020-04-23 HISTORY — DX: Prediabetes: R73.03

## 2020-04-23 HISTORY — DX: Anxiety disorder, unspecified: F41.9

## 2020-04-23 LAB — HEMOGLOBIN A1C
Hgb A1c MFr Bld: 5.6 % (ref 4.8–5.6)
Mean Plasma Glucose: 114.02 mg/dL

## 2020-04-23 LAB — URINALYSIS, ROUTINE W REFLEX MICROSCOPIC
Bilirubin Urine: NEGATIVE
Glucose, UA: NEGATIVE mg/dL
Hgb urine dipstick: NEGATIVE
Ketones, ur: NEGATIVE mg/dL
Leukocytes,Ua: NEGATIVE
Nitrite: NEGATIVE
Protein, ur: NEGATIVE mg/dL
Specific Gravity, Urine: 1.017 (ref 1.005–1.030)
pH: 5 (ref 5.0–8.0)

## 2020-04-23 LAB — BASIC METABOLIC PANEL
Anion gap: 11 (ref 5–15)
BUN: 18 mg/dL (ref 8–23)
CO2: 24 mmol/L (ref 22–32)
Calcium: 9.5 mg/dL (ref 8.9–10.3)
Chloride: 102 mmol/L (ref 98–111)
Creatinine, Ser: 1.04 mg/dL (ref 0.61–1.24)
GFR, Estimated: >60 mL/min (ref >60)
Glucose, Bld: 112 mg/dL — ABNORMAL HIGH (ref 70–99)
Potassium: 3.7 mmol/L (ref 3.5–5.1)
Sodium: 137 mmol/L (ref 135–145)

## 2020-04-23 LAB — APTT: aPTT: 29 seconds (ref 24–36)

## 2020-04-23 LAB — CBC
HCT: 41 % (ref 39.0–52.0)
Hemoglobin: 13.2 g/dL (ref 13.0–17.0)
MCH: 29.3 pg (ref 26.0–34.0)
MCHC: 32.2 g/dL (ref 30.0–36.0)
MCV: 90.9 fL (ref 80.0–100.0)
Platelets: 168 10*3/uL (ref 150–400)
RBC: 4.51 MIL/uL (ref 4.22–5.81)
RDW: 13.2 % (ref 11.5–15.5)
WBC: 6.1 10*3/uL (ref 4.0–10.5)
nRBC: 0 % (ref 0.0–0.2)

## 2020-04-23 LAB — PROTIME-INR
INR: 1 (ref 0.8–1.2)
Prothrombin Time: 13.1 seconds (ref 11.4–15.2)

## 2020-04-23 LAB — SURGICAL PCR SCREEN
MRSA, PCR: NEGATIVE
Staphylococcus aureus: NEGATIVE

## 2020-04-23 LAB — SARS CORONAVIRUS 2 (TAT 6-24 HRS): SARS Coronavirus 2: NEGATIVE

## 2020-04-23 NOTE — Progress Notes (Signed)
PCP - Dr. Miguel Aschoff Cardiologist -   PPM/ICD - Denies  Chest x-ray - 04/23/20 EKG - 02/11/20 Stress Test - Denies ECHO - 02/14/20 Cardiac Cath - Denies  Sleep Study - Yes CPAP - Yes  Patient denies being diabetic.  Blood Thinner Instructions: N/A Aspirin Instructions: LD: 04/18/20  ERAS Protcol - No  COVID TEST- 04/23/20   Anesthesia review: Yes, per MD order  Patient denies shortness of breath, fever, cough and chest pain at PAT appointment   All instructions explained to the patient, with a verbal understanding of the material. Patient agrees to go over the instructions while at home for a better understanding. Patient also instructed to self quarantine after being tested for COVID-19. The opportunity to ask questions was provided.

## 2020-04-24 NOTE — Anesthesia Preprocedure Evaluation (Addendum)
Anesthesia Evaluation  Patient identified by MRN, date of birth, ID band Patient awake    Reviewed: Allergy & Precautions, NPO status , Patient's Chart, lab work & pertinent test results  Airway Mallampati: I  TM Distance: >3 FB Neck ROM: Full    Dental no notable dental hx.    Pulmonary sleep apnea and Continuous Positive Airway Pressure Ventilation , former smoker,    Pulmonary exam normal breath sounds clear to auscultation       Cardiovascular hypertension, Pt. on medications Normal cardiovascular exam Rhythm:Regular Rate:Normal  ECG: NSR, rate 61  ECHO: 1. Left ventricular ejection fraction, by estimation, is 55 to 60%. The left ventricle has normal function. The left ventricle has no regional wall motion abnormalities. There is mild left ventricular hypertrophy. Left ventricular diastolic parameters are consistent with age-related delayed relaxation (normal). 2. Right ventricular systolic function is normal. The right ventricular size is normal. There is normal pulmonary artery systolic pressure. 3. The mitral valve is normal in structure. Mild mitral valve regurgitation. 4. The aortic valve is tricuspid. Aortic valve regurgitation is mild. 5. The inferior vena cava is normal in size with greater than 50% respiratory variability, suggesting right atrial pressure of 3 mmHg.   Neuro/Psych PSYCHIATRIC DISORDERS Anxiety Depression negative neurological ROS     GI/Hepatic Neg liver ROS, GERD  Medicated and Controlled,  Endo/Other  negative endocrine ROS  Renal/GU negative Renal ROS     Musculoskeletal negative musculoskeletal ROS (+)   Abdominal (+) + obese,   Peds  Hematology HLD   Anesthesia Other Findings Left L5-S1 disc herniation with radiculopathy  Reproductive/Obstetrics                           Anesthesia Physical Anesthesia Plan  ASA: III  Anesthesia Plan: General    Post-op Pain Management:    Induction: Intravenous  PONV Risk Score and Plan: 2 and Ondansetron, Dexamethasone, Midazolam and Treatment may vary due to age or medical condition  Airway Management Planned: Oral ETT  Additional Equipment:   Intra-op Plan:   Post-operative Plan: Extubation in OR  Informed Consent: I have reviewed the patients History and Physical, chart, labs and discussed the procedure including the risks, benefits and alternatives for the proposed anesthesia with the patient or authorized representative who has indicated his/her understanding and acceptance.     Dental advisory given  Plan Discussed with: CRNA  Anesthesia Plan Comments: (PAT note by Karoline Caldwell, PA-C: Patient had recent cardiac eval for report of chest pain.  Work-up was benign and he has denied any more recent symptoms of chest pain.  Last seen by cardiologist Dr.Agbor-Etang 03/27/2020.  Per note, "Patient with history of chest pain, risk factors of age, hypertension, hyperlipidemia.  Echocardiogram showed normal systolic and diastolic function, EF 55 to 60%, mild MR.  Overall no gross structural abnormalities to suggest etiology of chest pain.  Coronary CTA had a low calcium score of 8.29, no evidence of CAD.  Patient made aware of results and reassured."  OSA on CPAP.  Preop labs reviewed, unremarkable.  EKG 02/10/2020: NSR.  Rate 61.  LAD.  CT coronary morphology 03/08/2020: IMPRESSION: 1. Low coronary calcium score of 8.29. This was 24th percentile for age and sex matched control. 2. Normal coronary origin with right dominance. 3. No evidence of CAD. 4.  CAD-RADS 0. Consider non-atherosclerotic causes of chest pain.   TTE 02/14/2020: 1. Left ventricular ejection fraction, by estimation, is 55 to  60%. The  left ventricle has normal function. The left ventricle has no regional  wall motion abnormalities. There is mild left ventricular hypertrophy.  Left ventricular diastolic parameters   are consistent with age-related delayed relaxation (normal).  2. Right ventricular systolic function is normal. The right ventricular  size is normal. There is normal pulmonary artery systolic pressure.  3. The mitral valve is normal in structure. Mild mitral valve  regurgitation.  4. The aortic valve is tricuspid. Aortic valve regurgitation is mild.  5. The inferior vena cava is normal in size with greater than 50%  respiratory variability, suggesting right atrial pressure of 3 mmHg.   )      Anesthesia Quick Evaluation

## 2020-04-24 NOTE — Progress Notes (Signed)
Anesthesia Chart Review:  Patient had recent cardiac eval for report of chest pain.  Work-up was benign and he has denied any more recent symptoms of chest pain.  Last seen by cardiologist Dr.Agbor-Etang 03/27/2020.  Per note, "Patient with history of chest pain, risk factors of age, hypertension, hyperlipidemia.  Echocardiogram showed normal systolic and diastolic function, EF 55 to 60%, mild MR.  Overall no gross structural abnormalities to suggest etiology of chest pain.  Coronary CTA had a low calcium score of 8.29, no evidence of CAD.  Patient made aware of results and reassured."  OSA on CPAP.  Preop labs reviewed, unremarkable.  EKG 02/10/2020: NSR.  Rate 61.  LAD.  CT coronary morphology 03/08/2020: IMPRESSION: 1. Low coronary calcium score of 8.29. This was 24th percentile for age and sex matched control.  2. Normal coronary origin with right dominance.  3. No evidence of CAD.  4.  CAD-RADS 0. Consider non-atherosclerotic causes of chest pain.   TTE 02/14/2020: 1. Left ventricular ejection fraction, by estimation, is 55 to 60%. The  left ventricle has normal function. The left ventricle has no regional  wall motion abnormalities. There is mild left ventricular hypertrophy.  Left ventricular diastolic parameters  are consistent with age-related delayed relaxation (normal).  2. Right ventricular systolic function is normal. The right ventricular  size is normal. There is normal pulmonary artery systolic pressure.  3. The mitral valve is normal in structure. Mild mitral valve  regurgitation.  4. The aortic valve is tricuspid. Aortic valve regurgitation is mild.  5. The inferior vena cava is normal in size with greater than 50%  respiratory variability, suggesting right atrial pressure of 3 mmHg.    Wynonia Musty [facility]/Anesthesiology Phone [phone] 04/24/2020 9:12 AM

## 2020-04-25 ENCOUNTER — Ambulatory Visit (HOSPITAL_COMMUNITY)
Admission: RE | Disposition: A | Discharge status: Home / Self Care | Payer: Self-pay | Source: Home / Self Care | Attending: Orthopedic Surgery

## 2020-04-25 ENCOUNTER — Ambulatory Visit (HOSPITAL_COMMUNITY): Payer: Medicare HMO | Admitting: Anesthesiology

## 2020-04-25 ENCOUNTER — Ambulatory Visit (HOSPITAL_COMMUNITY): Payer: Medicare HMO

## 2020-04-25 ENCOUNTER — Encounter (HOSPITAL_COMMUNITY): Payer: Self-pay | Admitting: Orthopedic Surgery

## 2020-04-25 ENCOUNTER — Ambulatory Visit (HOSPITAL_COMMUNITY)
Admission: RE | Admit: 2020-04-25 | Discharge: 2020-04-25 | Disposition: A | Discharge status: Home / Self Care | Payer: Medicare HMO | Attending: Orthopedic Surgery | Admitting: Orthopedic Surgery

## 2020-04-25 ENCOUNTER — Other Ambulatory Visit: Payer: Self-pay

## 2020-04-25 ENCOUNTER — Ambulatory Visit (HOSPITAL_COMMUNITY): Payer: Medicare HMO | Admitting: Physician Assistant

## 2020-04-25 DIAGNOSIS — I080 Rheumatic disorders of both mitral and aortic valves: Secondary | ICD-10-CM | POA: Diagnosis not present

## 2020-04-25 DIAGNOSIS — I10 Essential (primary) hypertension: Secondary | ICD-10-CM | POA: Diagnosis not present

## 2020-04-25 DIAGNOSIS — G4730 Sleep apnea, unspecified: Secondary | ICD-10-CM | POA: Insufficient documentation

## 2020-04-25 DIAGNOSIS — Z419 Encounter for procedure for purposes other than remedying health state, unspecified: Secondary | ICD-10-CM

## 2020-04-25 DIAGNOSIS — Z801 Family history of malignant neoplasm of trachea, bronchus and lung: Secondary | ICD-10-CM | POA: Diagnosis not present

## 2020-04-25 DIAGNOSIS — Z79899 Other long term (current) drug therapy: Secondary | ICD-10-CM | POA: Diagnosis not present

## 2020-04-25 DIAGNOSIS — Z83511 Family history of glaucoma: Secondary | ICD-10-CM | POA: Diagnosis not present

## 2020-04-25 DIAGNOSIS — Z803 Family history of malignant neoplasm of breast: Secondary | ICD-10-CM | POA: Diagnosis not present

## 2020-04-25 DIAGNOSIS — Z87891 Personal history of nicotine dependence: Secondary | ICD-10-CM | POA: Insufficient documentation

## 2020-04-25 DIAGNOSIS — Z981 Arthrodesis status: Secondary | ICD-10-CM | POA: Diagnosis not present

## 2020-04-25 DIAGNOSIS — K219 Gastro-esophageal reflux disease without esophagitis: Secondary | ICD-10-CM | POA: Diagnosis not present

## 2020-04-25 DIAGNOSIS — E785 Hyperlipidemia, unspecified: Secondary | ICD-10-CM | POA: Insufficient documentation

## 2020-04-25 DIAGNOSIS — M47816 Spondylosis without myelopathy or radiculopathy, lumbar region: Secondary | ICD-10-CM | POA: Diagnosis not present

## 2020-04-25 DIAGNOSIS — M21372 Foot drop, left foot: Secondary | ICD-10-CM | POA: Diagnosis not present

## 2020-04-25 DIAGNOSIS — Z808 Family history of malignant neoplasm of other organs or systems: Secondary | ICD-10-CM | POA: Insufficient documentation

## 2020-04-25 DIAGNOSIS — F32A Depression, unspecified: Secondary | ICD-10-CM | POA: Insufficient documentation

## 2020-04-25 DIAGNOSIS — F411 Generalized anxiety disorder: Secondary | ICD-10-CM | POA: Diagnosis not present

## 2020-04-25 DIAGNOSIS — Z7982 Long term (current) use of aspirin: Secondary | ICD-10-CM | POA: Diagnosis not present

## 2020-04-25 DIAGNOSIS — G8929 Other chronic pain: Secondary | ICD-10-CM | POA: Diagnosis not present

## 2020-04-25 DIAGNOSIS — F419 Anxiety disorder, unspecified: Secondary | ICD-10-CM | POA: Insufficient documentation

## 2020-04-25 DIAGNOSIS — K5790 Diverticulosis of intestine, part unspecified, without perforation or abscess without bleeding: Secondary | ICD-10-CM | POA: Diagnosis not present

## 2020-04-25 DIAGNOSIS — R69 Illness, unspecified: Secondary | ICD-10-CM | POA: Diagnosis not present

## 2020-04-25 DIAGNOSIS — Z833 Family history of diabetes mellitus: Secondary | ICD-10-CM | POA: Diagnosis not present

## 2020-04-25 DIAGNOSIS — E7800 Pure hypercholesterolemia, unspecified: Secondary | ICD-10-CM | POA: Diagnosis not present

## 2020-04-25 DIAGNOSIS — M5117 Intervertebral disc disorders with radiculopathy, lumbosacral region: Secondary | ICD-10-CM | POA: Diagnosis not present

## 2020-04-25 HISTORY — PX: LUMBAR LAMINECTOMY/DECOMPRESSION MICRODISCECTOMY: SHX5026

## 2020-04-25 SURGERY — LUMBAR LAMINECTOMY/DECOMPRESSION MICRODISCECTOMY 1 LEVEL
Anesthesia: General | Site: Back | Laterality: Left

## 2020-04-25 MED ORDER — METHOCARBAMOL 500 MG PO TABS: 500.0000 mg | ORAL_TABLET | Freq: Three times a day (TID) | ORAL | 0 refills | Status: AC | PRN

## 2020-04-25 MED ORDER — SUGAMMADEX SODIUM 500 MG/5ML IV SOLN
INTRAVENOUS | Status: AC
  Filled 2020-04-25: qty 5

## 2020-04-25 MED ORDER — EPINEPHRINE PF 1 MG/ML IJ SOLN
INTRAMUSCULAR | Status: AC
  Filled 2020-04-25: qty 1

## 2020-04-25 MED ORDER — ACETAMINOPHEN 10 MG/ML IV SOLN
INTRAVENOUS | Status: DC | PRN
  Administered 2020-04-25: 1000 mg via INTRAVENOUS

## 2020-04-25 MED ORDER — METHYLPREDNISOLONE ACETATE 40 MG/ML IJ SUSP
INTRAMUSCULAR | Status: AC
  Filled 2020-04-25: qty 1

## 2020-04-25 MED ORDER — ORAL CARE MOUTH RINSE: 15.0000 mL | Freq: Once | OROMUCOSAL | Status: AC

## 2020-04-25 MED ORDER — METHYLPREDNISOLONE ACETATE 40 MG/ML IJ SUSP
INTRAMUSCULAR | Status: DC | PRN
  Administered 2020-04-25: 40 mg

## 2020-04-25 MED ORDER — CHLORHEXIDINE GLUCONATE 0.12 % MT SOLN
OROMUCOSAL | Status: AC
  Administered 2020-04-25: 15 mL via OROMUCOSAL
  Filled 2020-04-25: qty 15

## 2020-04-25 MED ORDER — HEMOSTATIC AGENTS (NO CHARGE) OPTIME
TOPICAL | Status: DC | PRN
  Administered 2020-04-25: 1

## 2020-04-25 MED ORDER — EPHEDRINE 5 MG/ML INJ
INTRAVENOUS | Status: AC
  Filled 2020-04-25: qty 20

## 2020-04-25 MED ORDER — ROCURONIUM BROMIDE 100 MG/10ML IV SOLN
INTRAVENOUS | Status: DC | PRN
  Administered 2020-04-25: 30 mg via INTRAVENOUS
  Administered 2020-04-25: 60 mg via INTRAVENOUS

## 2020-04-25 MED ORDER — MIDAZOLAM HCL 5 MG/5ML IJ SOLN
INTRAMUSCULAR | Status: DC | PRN
  Administered 2020-04-25: 2 mg via INTRAVENOUS

## 2020-04-25 MED ORDER — ONDANSETRON HCL 4 MG PO TABS: 4.0000 mg | ORAL_TABLET | Freq: Three times a day (TID) | ORAL | 0 refills | Status: DC | PRN

## 2020-04-25 MED ORDER — ACETAMINOPHEN 10 MG/ML IV SOLN: 1000.0000 mg | Freq: Once | INTRAVENOUS | Status: DC | PRN

## 2020-04-25 MED ORDER — ROCURONIUM BROMIDE 10 MG/ML (PF) SYRINGE
PREFILLED_SYRINGE | INTRAVENOUS | Status: AC
  Filled 2020-04-25: qty 20

## 2020-04-25 MED ORDER — EPHEDRINE SULFATE 50 MG/ML IJ SOLN
INTRAMUSCULAR | Status: DC | PRN
  Administered 2020-04-25: 10 mg via INTRAVENOUS

## 2020-04-25 MED ORDER — EPINEPHRINE PF 1 MG/ML IJ SOLN
INTRAMUSCULAR | Status: DC | PRN
  Administered 2020-04-25: .15 mL

## 2020-04-25 MED ORDER — OXYCODONE-ACETAMINOPHEN 10-325 MG PO TABS: 1.0000 | ORAL_TABLET | Freq: Four times a day (QID) | ORAL | 0 refills | Status: AC | PRN

## 2020-04-25 MED ORDER — PHENYLEPHRINE HCL-NACL 10-0.9 MG/250ML-% IV SOLN
INTRAVENOUS | Status: DC | PRN
  Administered 2020-04-25: 40 ug/min via INTRAVENOUS

## 2020-04-25 MED ORDER — BUPIVACAINE HCL (PF) 0.25 % IJ SOLN
INTRAMUSCULAR | Status: AC
  Filled 2020-04-25: qty 30

## 2020-04-25 MED ORDER — FENTANYL CITRATE (PF) 250 MCG/5ML IJ SOLN
INTRAMUSCULAR | Status: AC
  Filled 2020-04-25: qty 5

## 2020-04-25 MED ORDER — FENTANYL CITRATE (PF) 100 MCG/2ML IJ SOLN: 25.0000 ug | INTRAMUSCULAR | Status: DC | PRN

## 2020-04-25 MED ORDER — CEFAZOLIN SODIUM-DEXTROSE 2-4 GM/100ML-% IV SOLN
INTRAVENOUS | Status: AC
  Filled 2020-04-25: qty 100

## 2020-04-25 MED ORDER — THROMBIN 20000 UNITS EX SOLR: CUTANEOUS | Status: DC | PRN

## 2020-04-25 MED ORDER — CEFAZOLIN SODIUM-DEXTROSE 2-4 GM/100ML-% IV SOLN
2.0000 g | INTRAVENOUS | Status: AC
  Administered 2020-04-25: 2 g via INTRAVENOUS

## 2020-04-25 MED ORDER — CHLORHEXIDINE GLUCONATE 0.12 % MT SOLN: 15.0000 mL | Freq: Once | OROMUCOSAL | Status: AC

## 2020-04-25 MED ORDER — ACETAMINOPHEN 10 MG/ML IV SOLN
INTRAVENOUS | Status: AC
  Filled 2020-04-25: qty 100

## 2020-04-25 MED ORDER — ONDANSETRON HCL 4 MG/2ML IJ SOLN: 4.0000 mg | Freq: Once | INTRAMUSCULAR | Status: DC | PRN

## 2020-04-25 MED ORDER — MIDAZOLAM HCL 2 MG/2ML IJ SOLN
INTRAMUSCULAR | Status: AC
  Filled 2020-04-25: qty 2

## 2020-04-25 MED ORDER — DEXAMETHASONE SODIUM PHOSPHATE 10 MG/ML IJ SOLN
INTRAMUSCULAR | Status: AC
  Filled 2020-04-25: qty 2

## 2020-04-25 MED ORDER — 0.9 % SODIUM CHLORIDE (POUR BTL) OPTIME
TOPICAL | Status: DC | PRN
  Administered 2020-04-25: 1000 mL

## 2020-04-25 MED ORDER — FENTANYL CITRATE (PF) 100 MCG/2ML IJ SOLN
INTRAMUSCULAR | Status: DC | PRN
Start: 2020-04-25 — End: 2020-04-25
  Administered 2020-04-25: 25 ug via INTRAVENOUS
  Administered 2020-04-25: 100 ug via INTRAVENOUS

## 2020-04-25 MED ORDER — PHENYLEPHRINE HCL (PRESSORS) 10 MG/ML IV SOLN
INTRAVENOUS | Status: DC | PRN
  Administered 2020-04-25 (×2): 80 ug via INTRAVENOUS

## 2020-04-25 MED ORDER — LIDOCAINE 2% (20 MG/ML) 5 ML SYRINGE
INTRAMUSCULAR | Status: AC
  Filled 2020-04-25: qty 5

## 2020-04-25 MED ORDER — THROMBIN (RECOMBINANT) 20000 UNITS EX SOLR
CUTANEOUS | Status: AC
  Filled 2020-04-25: qty 20000

## 2020-04-25 MED ORDER — PHENYLEPHRINE 40 MCG/ML (10ML) SYRINGE FOR IV PUSH (FOR BLOOD PRESSURE SUPPORT)
PREFILLED_SYRINGE | INTRAVENOUS | Status: AC
  Filled 2020-04-25: qty 10

## 2020-04-25 MED ORDER — DEXAMETHASONE SODIUM PHOSPHATE 10 MG/ML IJ SOLN
INTRAMUSCULAR | Status: DC | PRN
  Administered 2020-04-25: 10 mg via INTRAVENOUS

## 2020-04-25 MED ORDER — LIDOCAINE HCL (CARDIAC) PF 100 MG/5ML IV SOSY
PREFILLED_SYRINGE | INTRAVENOUS | Status: DC | PRN
  Administered 2020-04-25: 60 mg via INTRAVENOUS

## 2020-04-25 MED ORDER — PROPOFOL 10 MG/ML IV BOLUS
INTRAVENOUS | Status: DC | PRN
  Administered 2020-04-25: 200 mg via INTRAVENOUS

## 2020-04-25 MED ORDER — ONDANSETRON HCL 4 MG/2ML IJ SOLN
INTRAMUSCULAR | Status: AC
  Filled 2020-04-25: qty 2

## 2020-04-25 MED ORDER — BUPIVACAINE HCL (PF) 0.25 % IJ SOLN
INTRAMUSCULAR | Status: DC | PRN
  Administered 2020-04-25: 22 mL

## 2020-04-25 MED ORDER — LACTATED RINGERS IV SOLN: INTRAVENOUS | Status: DC

## 2020-04-25 MED ORDER — ONDANSETRON HCL 4 MG/2ML IJ SOLN
INTRAMUSCULAR | Status: DC | PRN
  Administered 2020-04-25: 4 mg via INTRAVENOUS

## 2020-04-25 MED ORDER — SUGAMMADEX SODIUM 200 MG/2ML IV SOLN
INTRAVENOUS | Status: DC | PRN
  Administered 2020-04-25: 300 mg via INTRAVENOUS

## 2020-04-25 MED ORDER — KETOROLAC TROMETHAMINE 15 MG/ML IJ SOLN: 15.0000 mg | Freq: Once | INTRAMUSCULAR | Status: DC | PRN

## 2020-04-25 MED ORDER — PROPOFOL 10 MG/ML IV BOLUS
INTRAVENOUS | Status: AC
  Filled 2020-04-25: qty 20

## 2020-04-25 SURGICAL SUPPLY — 55 items
BNDG GAUZE ELAST 4 BULKY (GAUZE/BANDAGES/DRESSINGS) ×2 IMPLANT
CANISTER SUCT 3000ML PPV (MISCELLANEOUS) ×2 IMPLANT
CLSR STERI-STRIP ANTIMIC 1/2X4 (GAUZE/BANDAGES/DRESSINGS) ×2 IMPLANT
CORD BIPOLAR FORCEPS 12FT (ELECTRODE) ×2 IMPLANT
COVER SURGICAL LIGHT HANDLE (MISCELLANEOUS) ×2 IMPLANT
COVER WAND RF STERILE (DRAPES) ×2 IMPLANT
DRAIN CHANNEL 15F RND FF W/TCR (WOUND CARE) IMPLANT
DRAPE POUCH INSTRU U-SHP 10X18 (DRAPES) ×2 IMPLANT
DRAPE SURG 17X23 STRL (DRAPES) ×2 IMPLANT
DRAPE U-SHAPE 47X51 STRL (DRAPES) ×2 IMPLANT
DRSG OPSITE POSTOP 3X4 (GAUZE/BANDAGES/DRESSINGS) ×2 IMPLANT
DRSG OPSITE POSTOP 4X6 (GAUZE/BANDAGES/DRESSINGS) ×2 IMPLANT
DURAPREP 26ML APPLICATOR (WOUND CARE) ×2 IMPLANT
ELECT BLADE 4.0 EZ CLEAN MEGAD (MISCELLANEOUS)
ELECT CAUTERY BLADE 6.4 (BLADE) ×2 IMPLANT
ELECT PENCIL ROCKER SW 15FT (MISCELLANEOUS) ×2 IMPLANT
ELECT REM PT RETURN 9FT ADLT (ELECTROSURGICAL) ×2
ELECTRODE BLDE 4.0 EZ CLN MEGD (MISCELLANEOUS) IMPLANT
ELECTRODE REM PT RTRN 9FT ADLT (ELECTROSURGICAL) ×1 IMPLANT
EVACUATOR SILICONE 100CC (DRAIN) IMPLANT
GLOVE BIO SURGEON STRL SZ 6.5 (GLOVE) ×2 IMPLANT
GLOVE BIOGEL PI IND STRL 6.5 (GLOVE) ×1 IMPLANT
GLOVE BIOGEL PI IND STRL 8.5 (GLOVE) ×1 IMPLANT
GLOVE BIOGEL PI INDICATOR 6.5 (GLOVE) ×1
GLOVE BIOGEL PI INDICATOR 8.5 (GLOVE) ×1
GLOVE SS BIOGEL STRL SZ 8.5 (GLOVE) ×1 IMPLANT
GLOVE SUPERSENSE BIOGEL SZ 8.5 (GLOVE) ×1
GOWN STRL REUS W/ TWL LRG LVL3 (GOWN DISPOSABLE) ×2 IMPLANT
GOWN STRL REUS W/TWL 2XL LVL3 (GOWN DISPOSABLE) ×2 IMPLANT
GOWN STRL REUS W/TWL LRG LVL3 (GOWN DISPOSABLE) ×2
KIT BASIN OR (CUSTOM PROCEDURE TRAY) ×2 IMPLANT
KIT TURNOVER KIT B (KITS) ×2 IMPLANT
NEEDLE 22X1 1/2 (OR ONLY) (NEEDLE) ×2 IMPLANT
NEEDLE SPNL 18GX3.5 QUINCKE PK (NEEDLE) ×4 IMPLANT
NS IRRIG 1000ML POUR BTL (IV SOLUTION) ×2 IMPLANT
PACK LAMINECTOMY ORTHO (CUSTOM PROCEDURE TRAY) ×2 IMPLANT
PACK UNIVERSAL I (CUSTOM PROCEDURE TRAY) ×2 IMPLANT
PAD ARMBOARD 7.5X6 YLW CONV (MISCELLANEOUS) ×4 IMPLANT
PATTIES SURGICAL .5 X.5 (GAUZE/BANDAGES/DRESSINGS) ×2 IMPLANT
PATTIES SURGICAL .5 X1 (DISPOSABLE) ×2 IMPLANT
SPONGE SURGIFOAM ABS GEL 100 (HEMOSTASIS) ×2 IMPLANT
SURGIFLO W/THROMBIN 8M KIT (HEMOSTASIS) ×2 IMPLANT
SUT BONE WAX W31G (SUTURE) ×2 IMPLANT
SUT MNCRL AB 3-0 PS2 27 (SUTURE) ×2 IMPLANT
SUT VIC AB 0 CT1 27 (SUTURE)
SUT VIC AB 0 CT1 27XBRD ANBCTR (SUTURE) IMPLANT
SUT VIC AB 1 CT1 18XCR BRD 8 (SUTURE) ×1 IMPLANT
SUT VIC AB 1 CT1 8-18 (SUTURE) ×1
SUT VIC AB 2-0 CT1 18 (SUTURE) ×2 IMPLANT
SYR BULB IRRIG 60ML STRL (SYRINGE) ×2 IMPLANT
SYR CONTROL 10ML LL (SYRINGE) ×2 IMPLANT
TOWEL GREEN STERILE (TOWEL DISPOSABLE) ×2 IMPLANT
TOWEL GREEN STERILE FF (TOWEL DISPOSABLE) ×2 IMPLANT
WATER STERILE IRR 1000ML POUR (IV SOLUTION) ×2 IMPLANT
YANKAUER SUCT BULB TIP NO VENT (SUCTIONS) IMPLANT

## 2020-04-25 NOTE — Discharge Instructions (Signed)
Disectomy Aftercare   This sheet gives you information about how to care for yourself after your procedure. Your health care provider may also give you more specific instructions. If you have problems or questions, contact your health care provider. What can I expect after the procedure? After the procedure, it is common to have:  Some pain around your incision area.  Muscle tightening (spasms) across the back.   Follow these instructions at home: Incision care  Follow instructions from your health care provider about how to take care of your incision area. Make sure you: ? Wash your hands with soap and water before and after you apply medicine to the area or change your bandage (dressing). If soap and water are not available, use hand sanitizer. ? Change your dressing as told by your health care provider. ? Leave stitches (sutures), skin glue, or adhesive strips in place. These skin closures may need to stay in place for 2 weeks or longer. If adhesive strip edges start to loosen and curl up, you may trim the loose edges. Do not remove adhesive strips completely unless your health care provider tells you to do that.    Check your incision area every day for signs of infection. Check for: ? More redness, swelling, or pain. ? More fluid or blood. ? Warmth. ? Pus or a bad smell. Medicines  Take over-the-counter and prescription medicines only as told by your health care provider.  If you were prescribed an antibiotic medicine, use it as told by your health care provider. Do not stop using the antibiotic even if you start to feel better.  If needed, call office in 3 days to request refill of pain medications. Bathing  Do not take baths, swim, or use a hot tub for 6 weeks, or until your incision has healed completely.  If your health care provider approves, you may take showers after your dressing has been removed.  Ok to shower in 5 days Activity  Return to your normal  activities as told by your health care provider. Ask your health care provider what activities are safe for you.  Avoid bending or twisting at your waist. Always bend at your knees.  Do not sit for more than 20-30 minutes at a time. Lie down or walk between periods of sitting.  Do not lift anything that is heavier than 10 lb (4.5 kg) or the limit that your health care provider tells you, until he or she says that it is safe.  Do not drive for 2 weeks after your procedure or for as long as your health care provider tells you.    Do not drive or use heavy machinery while taking prescription pain medicine. General instructions  To prevent or treat constipation while you are taking prescription pain medicine, your health care provider may recommend that you: ? Drink enough fluid to keep your urine clear or pale yellow. ? Take over-the-counter or prescription medicines. ? Eat foods that are high in fiber, such as fresh fruits and vegetables, whole grains, and beans. ? Limit foods that are high in fat and processed sugars, such as fried and sweet foods.  Do breathing exercises as told.  Keep all follow-up visits as told by your health care provider. This is important. Contact a health care provider if:  You have more redness, swelling, or pain around your incision area.  Your incision feels warm to the touch.  You are not able to return to activities or do exercises as  told by your health care provider. Get help right away if:  You have: ? More fluid or blood coming from your incision area. ? Pus or a bad smell coming from your incision area. ? Chills or a fever. ? Episodes of dizziness or fainting while standing.  You develop a rash.  You develop shortness of breath or you have difficulty breathing.  You cannot control when you urinate or have a bowel movement.  You become weak.  You are not able to use your legs. Summary  After the procedure, it is common to have some  pain around your incision area. You may also have muscle tightening (spasms) across the back.  Follow instructions from your health care provider about how to care for your incision.  Do not lift anything that is heavier than 10 lb (4.5 kg) or the limit that your health care provider tells you, until he or she says that it is safe.  Contact your health care provider if you have more redness, swelling, or pain around your incision area or if your incision feels warm to the touch. These can be signs of infection. This information is not intended to replace advice given to you by your health care provider. Make sure you discuss any questions you have with your health care provider. Refer to this sheet in the next few weeks. These instructions provide you with information about caring for yourself after your procedure. Your health care provider may also give you more specific instructions. Your treatment has been planned according to current medical practices, but problems sometimes occur. Call your health care provider if you have any problems or questions after your procedure. What can I expect after the procedure? It is common to have pain for the first few days after the procedure. Some people continue to have mild pain even after making a full recovery. Follow these instructions at home: Medicine  Take medicines only as directed by your health care provider.  Avoid taking over-the-counter pain medicines unless your health care provider tells you otherwise. These medicines interfere with the development and growth of new bone cells.  If you were prescribed a narcotic pain medicine, take it exactly as told by your health care provider. ? Do not drink alcohol while on the medicine. ? Do not drive while on the medicine. Injury care  Care for your back brace as told by your health care provider.  If directed, apply ice to the injured area: ? Put ice in a plastic bag. ? Place a towel between  your skin and the bag. ? Leave the ice on for 20 minutes, 2-3 times a day. Activity  Perform physical therapy exercises as told by your health care provider.  Exercise regularly. Start by taking short walks. Slowly increase your activity level over time. Gentle exercise helps to ease pain.  Sit, stand, walk, turn in bed, and reposition yourself as told by your health care provider. This will help to keep your spine in proper alignment.  Avoid bending and twisting your body.  Avoid doing strenuous household chores, such as vacuuming.  Do not lift anything that is heavier than 10 lb (4.5 kg). Other Instructions  Keep all follow-up visits as directed by your health care provider. This is important.  Do not use any tobacco products, including cigarettes, chewing tobacco, or electronic cigarettes. If you need help quitting, ask your health care provider. Nicotine affects the way bones heal. Contact a health care provider if:  Your pain  gets worse.  You have a fever.  You have redness, swelling, or pain at the site of your incision.  You have fluid, blood, or pus coming from your incision.  You have numbness, tingling, or weakness in any part of your body. Get help right away if:  Your incision feels swollen and tender, and the surrounding area looks like a lump. The lump may be red or bluish in color.  You cannot move any part of your body (paralysis).  You cannot control your bladder or bowels.

## 2020-04-25 NOTE — Op Note (Signed)
Operative report  Preoperative diagnosis: L5-S1 disc herniation with left S1 radiculopathy  Postoperative diagnosis: Same  Operative procedure: L5-S1 left-sided discectomy (hemilaminotomy for discectomy)  CPT code: 27062  First Assistant: Cleta Alberts, PA  Complications: None  EBL: 100 cc  Indications: This is a very pleasant 69 year old gentleman who presents with acute onset of severe radicular left leg pain.  Attempts at conservative management had failed to alleviate his pain exact clinical exam and MRI findings were consistent with lumbar radiculopathy due to a herniated disc fragment at L5-S1.  As result of the poor quality of life and the failure to improve with conservative treatment we elected to move forward with surgery.  All appropriate risks benefits and alternatives were discussed with the patient and consent was obtained.  Operative report: Patient was brought the operating room placed upon the operating room table.  After successful induction of general anesthesia and endotracheal intubation, teds and SCDs were applied and he was turned prone onto the Wilson frame.  All bony prominences were well-padded and the back was prepped and draped in a standard fashion.  Timeout was taken to confirm patient procedure and all other important data.  2 needles were then placed into the back and an x-ray was taken for localization of the incision.  I marked out the L5-S1 disc space level and infiltrated the incision with quarter percent Marcaine with epinephrine.  Midline incision was made and sharp dissection was carried out down to the deep fascia.  Th paraspinal muscles were then infiltrated with quarter percent Marcaine with epinephrine for intraoperative hemostasis in postoperative analgesia.  I stripped the paraspinal muscles to expose the L5 and S1 lamina and the facet complex.  Penfield 4 was placed underneath the L5 lamina and an x-ray was taken confirming that is at the appropriate  level.  Once I confirmed the L5 level a laminotomy was performed with the 2 and 3 mm Kerrison rongeur.  Penfield 4 was used to dissect through the ligamentum flavum and create a plane between the thecal sac and the ligamentum flavum.  2 mm Kerrison rongeur was used to resect the ligamentum flavum and expose the thecal sac.  The L5 nerve root was noted to be dorsally displaced and under significant tension.  In order to safely perform the surgery I did do a medial facetectomy with a 2 mm Kerrison rongeur.  This allowed me to get around the nerve into the lateral recess without excessive traction.  Identified the disc fragment and removed it easily with a micropituitary rongeurs.  There were 2 large fragments of disc material removed once this was done the nerve root was no longer under significant compression and was more easily mobile.  I continue to retract the nerve root and the thecal sac and I was sweeping under the annulus removing other smaller fragments of disc material.  At this point I felt as though I removed all the disc fragments as the amount I removed was consistent with what was seen on the preoperative MRI.  I was able to pass a [facility] along the annulus medially superiorly in the lateral recess and inferiorly along the S1 nerve root into the foramen.  The nerve root itself was freely mobile and no longer under compression.  At this point I was pleased with the overall decompression/discectomy.  I used bipolar guardian FloSeal to obtain and maintain hemostasis.  After final irrigation I then placed approximately 20 mg (1/2 cc) of Depo-Medrol over the nerve to  aid in postoperative analgesia.  The retractors were removed and I did place a thrombin-soaked Gelfoam patty over the laminotomy site.  The deep fascia was closed in a layered fashion with interrupted #1 Vicryl suture, then a layer 2-0 Vicryl suture, and finally 3-0 Monocryl for the skin.  Steri-Strips and a dry dressing were applied  and the patient was ultimately extubated transfer the PACU without incident.  The end of the case all needle sponge counts were correct.  There were no adverse intraoperative events.

## 2020-04-25 NOTE — Brief Op Note (Signed)
04/25/2020  5:00 PM  PATIENT:  Steven Russell  69 y.o. male  PRE-OPERATIVE DIAGNOSIS:  Left L5-S1 disc herniation with radiculopathy  POST-OPERATIVE DIAGNOSIS:  Left L5-S1 disc herniation with radiculopathy  PROCEDURE:  Procedure(s) with comments: Left Lumbar five- Sacral one Disectomy (Left) - 2 hrs  SURGEON:  Surgeon(s) and Role:    Melina Schools, MD - Primary  PHYSICIAN ASSISTANT:   ASSISTANTS: Amanda Ward, PA  ANESTHESIA:   general  EBL:  100 cc   BLOOD ADMINISTERED:none  DRAINS: none   LOCAL MEDICATIONS USED:  MARCAINE   and Depo-Medrol  SPECIMEN:  No Specimen  DISPOSITION OF SPECIMEN:  N/A  COUNTS:  YES  TOURNIQUET:  * No tourniquets in log *  DICTATION: .Dragon Dictation  PLAN OF CARE: Admit for overnight observation  PATIENT DISPOSITION:  PACU - hemodynamically stable.

## 2020-04-25 NOTE — Anesthesia Postprocedure Evaluation (Signed)
Anesthesia Post Note  Patient: Steven Russell  Procedure(s) Performed: Left Lumbar five- Sacral one Disectomy (Left Back)     Patient location during evaluation: PACU Anesthesia Type: General Level of consciousness: awake and alert Pain management: pain level controlled Vital Signs Assessment: post-procedure vital signs reviewed and stable Respiratory status: spontaneous breathing, nonlabored ventilation, respiratory function stable and patient connected to nasal cannula oxygen Cardiovascular status: blood pressure returned to baseline and stable Postop Assessment: no apparent nausea or vomiting Anesthetic complications: no   No complications documented.  Last Vitals:  Vitals:   04/25/20 1745 04/25/20 1800  BP: 119/69 122/62  Pulse: 66 74  Resp: 13 12  Temp:    SpO2: 95% 92%    Last Pain:  Vitals:   04/25/20 1800  TempSrc:   PainSc: 0-No pain    LLE Motor Response: Purposeful movement;Responds to commands (04/25/20 1800) LLE Sensation: Full sensation (04/25/20 1800) RLE Motor Response: Purposeful movement;Responds to commands (04/25/20 1800) RLE Sensation: Full sensation (04/25/20 1800)      Dalaina Tates DAVID

## 2020-04-25 NOTE — Anesthesia Procedure Notes (Signed)
Procedure Name: Intubation Date/Time: 04/25/2020 3:11 PM Performed by: Rehan Holness T, CRNA Pre-anesthesia Checklist: Patient identified, Emergency Drugs available, Suction available and Patient being monitored Patient Re-evaluated:Patient Re-evaluated prior to induction Oxygen Delivery Method: Circle system utilized Preoxygenation: Pre-oxygenation with 100% oxygen Induction Type: IV induction Ventilation: Mask ventilation without difficulty Laryngoscope Size: Miller and 3 Grade View: Grade II Tube type: Oral Tube size: 7.5 mm Number of attempts: 1 Airway Equipment and Method: Stylet and Oral airway Placement Confirmation: ETT inserted through vocal cords under direct vision,  positive ETCO2 and breath sounds checked- equal and bilateral Secured at: 22 cm Tube secured with: Tape Dental Injury: Teeth and Oropharynx as per pre-operative assessment

## 2020-04-25 NOTE — H&P (Signed)
Addendum H&P.  Steven Russell is a very pleasant 69 year old gentleman with severe radicular left leg pain secondary to a L5-S1 disc herniation with left S1 nerve root compression.  There has been no change in his clinical exam since his last office visit of 04/20/2020.  Surgical plan is a lumbar microdiscectomy.  I have gone over the risks, benefits, as well as alternatives to surgery with the patient all of his questions were addressed and he is expressed a desire to move forward with surgery.

## 2020-04-25 NOTE — Transfer of Care (Signed)
Immediate Anesthesia Transfer of Care Note  Patient: Steven Russell  Procedure(s) Performed: Left Lumbar five- Sacral one Disectomy (Left Back)  Patient Location: PACU  Anesthesia Type:General  Level of Consciousness: drowsy  Airway & Oxygen Therapy: Patient Spontanous Breathing and Patient connected to nasal cannula oxygen  Post-op Assessment: Report given to RN, Post -op Vital signs reviewed and stable and Patient moving all extremities  Post vital signs: Reviewed and stable  Last Vitals:  Vitals Value Taken Time  BP 112/68 04/25/20 1726  Temp    Pulse 65 04/25/20 1729  Resp 15 04/25/20 1729  SpO2 94 % 04/25/20 1729  Vitals shown include unvalidated device data.  Last Pain:  Vitals:   04/25/20 1314  TempSrc:   PainSc: 3          Complications: No complications documented.

## 2020-04-25 NOTE — OR Nursing (Signed)
Per protocol Dr. Carlis Abbott called at 1556 on 04/25/2020, and confirmed in the last image instrumentation was located at disc space L5-S1.

## 2020-04-26 ENCOUNTER — Encounter (HOSPITAL_COMMUNITY): Payer: Self-pay | Admitting: Orthopedic Surgery

## 2020-04-26 MED FILL — Thrombin (Recombinant) For Soln 20000 Unit: CUTANEOUS | Qty: 1 | Status: AC

## 2020-06-13 ENCOUNTER — Other Ambulatory Visit: Payer: Self-pay

## 2020-06-13 ENCOUNTER — Encounter: Payer: Self-pay | Admitting: Family Medicine

## 2020-06-13 ENCOUNTER — Ambulatory Visit (INDEPENDENT_AMBULATORY_CARE_PROVIDER_SITE_OTHER): Payer: Medicare HMO | Admitting: Family Medicine

## 2020-06-13 VITALS — BP 119/66 | HR 56 | Temp 97.7°F | Wt 234.0 lb

## 2020-06-13 DIAGNOSIS — I10 Essential (primary) hypertension: Secondary | ICD-10-CM

## 2020-06-13 DIAGNOSIS — F32A Depression, unspecified: Secondary | ICD-10-CM

## 2020-06-13 DIAGNOSIS — E7800 Pure hypercholesterolemia, unspecified: Secondary | ICD-10-CM | POA: Diagnosis not present

## 2020-06-13 DIAGNOSIS — R69 Illness, unspecified: Secondary | ICD-10-CM | POA: Diagnosis not present

## 2020-06-13 DIAGNOSIS — G4733 Obstructive sleep apnea (adult) (pediatric): Secondary | ICD-10-CM | POA: Diagnosis not present

## 2020-06-13 DIAGNOSIS — M5432 Sciatica, left side: Secondary | ICD-10-CM

## 2020-06-13 MED ORDER — AMLODIPINE BESYLATE 10 MG PO TABS: 10.0000 mg | ORAL_TABLET | Freq: Every day | ORAL | 3 refills | Status: DC

## 2020-06-13 NOTE — Progress Notes (Signed)
I,April Miller,acting as a scribe for Wilhemena Durie, MD.,have documented all relevant documentation on the behalf of Wilhemena Durie, MD,as directed by  Wilhemena Durie, MD while in the presence of Wilhemena Durie, MD.   Established patient visit   Patient: Steven Russell   DOB: September 04, 1950   69 y.o. Male  MRN: [id number] Visit Date: 06/13/2020  Today's healthcare provider: Wilhemena Durie, MD   Chief Complaint  Patient presents with  . Follow-up  . Hyperlipidemia  . Hypertension   Subjective    HPI  Patient feels well.  Home blood pressure readings are 140s over 90 at home.  He has no complaints. His depression is doing well and he has no desire to come off the Zoloft. Hypertension, follow-up  BP Readings from Last 3 Encounters:  06/13/20 119/66  04/25/20 122/62  04/23/20 (!) 148/88   Wt Readings from Last 3 Encounters:  06/13/20 234 lb (106.1 kg)  04/23/20 223 lb 8 oz (101.4 kg)  04/12/20 221 lb 12.8 oz (100.6 kg)     He was last seen for hypertension 2 months ago.  BP at that visit was 155/102. Management since that visit includes; Increased amlodipine to 10 mg daily.  See him back in 2 months to follow-up blood pressure. He reports excellent compliance with treatment. He is not having side effects.  He is exercising. He is not adherent to low salt diet.   Outside blood pressures are improving .  He does not smoke.  Use of agents associated with hypertension: none.   --------------------------------------------------------------------   Patient Active Problem List   Diagnosis Date Noted  . Allergic rhinitis 06/14/2015  . Basal cell carcinoma of skin 06/14/2015  . Clinical depression 06/14/2015  . DD (diverticular disease) 06/14/2015  . Anxiety, generalized 06/14/2015  . Acid reflux 06/14/2015  . Hypercholesteremia 06/14/2015  . Cannot sleep 06/14/2015  . Arthritis, degenerative 06/14/2015  . Avitaminosis D 06/14/2015   Past  Medical History:  Diagnosis Date  . Anxiety   . Depression   . GERD (gastroesophageal reflux disease)   . Hypertension   . Pre-diabetes   . Sleep apnea    Social History   Tobacco Use  . Smoking status: Former Research scientist (life sciences)  . Smokeless tobacco: Never Used  . Tobacco comment: quit 35 years ago  Vaping Use  . Vaping Use: Never used  Substance Use Topics  . Alcohol use: Yes    Alcohol/week: 1.0 standard drink    Types: 1 Cans of beer per week    Comment: wine or beer a glass EOD  . Drug use: No   No Known Allergies   Medications: Outpatient Medications Prior to Visit  Medication Sig  . acetaminophen (TYLENOL) 500 MG tablet Take 500 mg by mouth every 6 (six) hours as needed for mild pain.  Marland Kitchen amLODipine (NORVASC) 10 MG tablet Take 0.5 tablets (5 mg total) by mouth daily. (Patient taking differently: Take 10 mg by mouth daily. )  . esomeprazole (NEXIUM) 20 MG capsule Take 40 mg by mouth daily at 12 noon.   . sertraline (ZOLOFT) 50 MG tablet Take 1 1/2 tablets daily. (Patient taking differently: Take 75 mg by mouth daily. )  . atorvastatin (LIPITOR) 40 MG tablet Take 1 tablet (40 mg total) by mouth daily.  . ondansetron (ZOFRAN) 4 MG tablet Take 1 tablet (4 mg total) by mouth every 8 (eight) hours as needed for nausea or vomiting.   No facility-administered medications prior  to visit.    Review of Systems  Constitutional: Negative.  Negative for appetite change, chills and fever.  Respiratory: Negative.  Negative for chest tightness, shortness of breath and wheezing.   Cardiovascular: Negative.  Negative for chest pain and palpitations.  Gastrointestinal: Negative.  Negative for abdominal pain, nausea and vomiting.  Neurological: Negative for dizziness, light-headedness and headaches.      Objective    BP 119/66 (BP Location: Right Arm, Patient Position: Sitting, Cuff Size: Large)   Pulse (!) 56   Temp 97.7 F (36.5 C) (Oral)   Wt 234 lb (106.1 kg)   SpO2 99%   BMI 31.74  kg/m    Physical Exam Vitals reviewed.  Constitutional:      Appearance: He is well-developed.  HENT:     Head: Normocephalic and atraumatic.  Eyes:     General: No scleral icterus.    Conjunctiva/sclera: Conjunctivae normal.  Cardiovascular:     Rate and Rhythm: Normal rate and regular rhythm.     Comments: No carotid bruit Pulmonary:     Effort: Pulmonary effort is normal.     Breath sounds: Normal breath sounds.  Abdominal:     Palpations: Abdomen is soft.  Musculoskeletal:     Right lower leg: No edema.     Left lower leg: No edema.  Skin:    General: Skin is warm and dry.  Neurological:     General: No focal deficit present.     Mental Status: He is oriented to person, place, and time.  Psychiatric:        Mood and Affect: Mood normal.        Behavior: Behavior normal.        Thought Content: Thought content normal.        Judgment: Judgment normal.       No results found for any visits on 06/13/20.  Assessment & Plan     1. Primary hypertension Fairly well controlled.  Amlodipine to 10 mg daily consider adding ARB. - CBC with Differential/Platelet - Comprehensive metabolic panel - Lipid panel - TSH  2. Hypercholesteremia On Lipitor 40 - CBC with Differential/Platelet - Comprehensive metabolic panel - Lipid panel - TSH  3. Sciatic nerve pain, left Patient has had successful back surgery with relief of sciatica.  No neurologic deficit.  4. Depression, unspecified depression type On Zoloft indefinitely  5. OSA (obstructive sleep apnea)    No follow-ups on file.         Cedrick Partain Cranford Mon, MD  [facility] [phone] (phone) [phone] (fax)  [place]

## 2020-06-14 LAB — COMPREHENSIVE METABOLIC PANEL
ALT: 17 IU/L (ref 0–44)
AST: 21 IU/L (ref 0–40)
Albumin/Globulin Ratio: 1.8 (ref 1.2–2.2)
Albumin: 4.2 g/dL (ref 3.8–4.8)
Alkaline Phosphatase: 118 IU/L (ref 44–121)
BUN/Creatinine Ratio: 20 (ref 10–24)
BUN: 18 mg/dL (ref 8–27)
Bilirubin Total: 0.5 mg/dL (ref 0.0–1.2)
CO2: 22 mmol/L (ref 20–29)
Calcium: 9.2 mg/dL (ref 8.6–10.2)
Chloride: 105 mmol/L (ref 96–106)
Creatinine, Ser: 0.89 mg/dL (ref 0.76–1.27)
GFR calc Af Amer: 102 mL/min/{1.73_m2} (ref >59)
GFR calc non Af Amer: 88 mL/min/{1.73_m2} (ref >59)
Globulin, Total: 2.3 g/dL (ref 1.5–4.5)
Glucose: 110 mg/dL — ABNORMAL HIGH (ref 65–99)
Potassium: 4.6 mmol/L (ref 3.5–5.2)
Sodium: 140 mmol/L (ref 134–144)
Total Protein: 6.5 g/dL (ref 6.0–8.5)

## 2020-06-14 LAB — CBC WITH DIFFERENTIAL/PLATELET
Basophils Absolute: 0 10*3/uL (ref 0.0–0.2)
Basos: 1 %
EOS (ABSOLUTE): 0.1 10*3/uL (ref 0.0–0.4)
Eos: 2 %
Hematocrit: 35.5 % — ABNORMAL LOW (ref 37.5–51.0)
Hemoglobin: 11.8 g/dL — ABNORMAL LOW (ref 13.0–17.7)
Immature Grans (Abs): 0 10*3/uL (ref 0.0–0.1)
Immature Granulocytes: 1 %
Lymphocytes Absolute: 1.2 10*3/uL (ref 0.7–3.1)
Lymphs: 23 %
MCH: 29.7 pg (ref 26.6–33.0)
MCHC: 33.2 g/dL (ref 31.5–35.7)
MCV: 89 fL (ref 79–97)
Monocytes Absolute: 0.6 10*3/uL (ref 0.1–0.9)
Monocytes: 11 %
Neutrophils Absolute: 3.1 10*3/uL (ref 1.4–7.0)
Neutrophils: 62 %
Platelets: 150 10*3/uL (ref 150–450)
RBC: 3.97 x10E6/uL — ABNORMAL LOW (ref 4.14–5.80)
RDW: 12.1 % (ref 11.6–15.4)
WBC: 5 10*3/uL (ref 3.4–10.8)

## 2020-06-14 LAB — LIPID PANEL
Chol/HDL Ratio: 3.9 ratio (ref 0.0–5.0)
Cholesterol, Total: 131 mg/dL (ref 100–199)
HDL: 34 mg/dL — ABNORMAL LOW (ref >39)
LDL Chol Calc (NIH): 81 mg/dL (ref 0–99)
Triglycerides: 83 mg/dL (ref 0–149)
VLDL Cholesterol Cal: 16 mg/dL (ref 5–40)

## 2020-06-14 LAB — TSH: TSH: 2.17 u[IU]/mL (ref 0.450–4.500)

## 2020-07-03 ENCOUNTER — Telehealth: Payer: Self-pay | Admitting: Family Medicine

## 2020-07-03 DIAGNOSIS — R69 Illness, unspecified: Secondary | ICD-10-CM | POA: Diagnosis not present

## 2020-07-03 DIAGNOSIS — I10 Essential (primary) hypertension: Secondary | ICD-10-CM

## 2020-07-03 MED ORDER — CHLORTHALIDONE 25 MG PO TABS: 25.0000 mg | ORAL_TABLET | Freq: Every day | ORAL | 1 refills | Status: DC

## 2020-07-03 NOTE — Telephone Encounter (Signed)
Chlorthalidone 25 mg every morning.  The swelling should go away over the next couple of weeks.  If he develops any other symptoms we need to check him out.

## 2020-07-03 NOTE — Telephone Encounter (Signed)
Please advise 

## 2020-07-03 NOTE — Telephone Encounter (Signed)
Medication sent into the pharmacy. Left message for patient to call back. Ok for [facility] to advise as below.

## 2020-07-03 NOTE — Telephone Encounter (Signed)
Copied from [place] [phone]. Topic: General - Other >> Jul 03, 2020  8:17 AM Celene Kras wrote: Reason for CRM: PT calling stating that he was on Amlodipine 10 mg. He states that his legs started swelling badly and stopped taking the pill. Pt states that he gained around 10 pounds of fluid. He states that his BP has been 135-140/ 70. Pt is requesting to have a new BP medication. Please advise.    [id number] [place] [address] [place] [address] Phone: [phone] Fax: [phone] Hours: Not open 24 hours

## 2020-07-04 DIAGNOSIS — M5116 Intervertebral disc disorders with radiculopathy, lumbar region: Secondary | ICD-10-CM | POA: Diagnosis not present

## 2020-07-04 NOTE — Telephone Encounter (Signed)
Called and read note by Dr Rosanna Randy to patient written 07/03/20.  He verbalized understanding and will call back if he develops other symptoms as instructed.

## 2020-07-17 DIAGNOSIS — D2272 Melanocytic nevi of left lower limb, including hip: Secondary | ICD-10-CM | POA: Diagnosis not present

## 2020-07-17 DIAGNOSIS — L570 Actinic keratosis: Secondary | ICD-10-CM | POA: Diagnosis not present

## 2020-07-17 DIAGNOSIS — X32XXXA Exposure to sunlight, initial encounter: Secondary | ICD-10-CM | POA: Diagnosis not present

## 2020-07-17 DIAGNOSIS — D225 Melanocytic nevi of trunk: Secondary | ICD-10-CM | POA: Diagnosis not present

## 2020-07-17 DIAGNOSIS — L738 Other specified follicular disorders: Secondary | ICD-10-CM | POA: Diagnosis not present

## 2020-07-17 DIAGNOSIS — D2261 Melanocytic nevi of right upper limb, including shoulder: Secondary | ICD-10-CM | POA: Diagnosis not present

## 2020-07-17 DIAGNOSIS — D2262 Melanocytic nevi of left upper limb, including shoulder: Secondary | ICD-10-CM | POA: Diagnosis not present

## 2020-07-17 DIAGNOSIS — Z85828 Personal history of other malignant neoplasm of skin: Secondary | ICD-10-CM | POA: Diagnosis not present

## 2020-07-17 DIAGNOSIS — D485 Neoplasm of uncertain behavior of skin: Secondary | ICD-10-CM | POA: Diagnosis not present

## 2020-07-18 DIAGNOSIS — M5116 Intervertebral disc disorders with radiculopathy, lumbar region: Secondary | ICD-10-CM | POA: Diagnosis not present

## 2020-07-26 ENCOUNTER — Other Ambulatory Visit: Payer: Self-pay | Admitting: Family Medicine

## 2020-07-26 DIAGNOSIS — I10 Essential (primary) hypertension: Secondary | ICD-10-CM

## 2020-07-26 DIAGNOSIS — M5116 Intervertebral disc disorders with radiculopathy, lumbar region: Secondary | ICD-10-CM | POA: Diagnosis not present

## 2020-07-27 ENCOUNTER — Other Ambulatory Visit: Payer: Self-pay

## 2020-07-27 MED ORDER — ATORVASTATIN CALCIUM 40 MG PO TABS: 40.0000 mg | ORAL_TABLET | Freq: Every day | ORAL | 2 refills | Status: DC

## 2020-08-01 ENCOUNTER — Other Ambulatory Visit: Payer: Self-pay | Admitting: Family Medicine

## 2020-08-01 DIAGNOSIS — F411 Generalized anxiety disorder: Secondary | ICD-10-CM

## 2020-08-21 ENCOUNTER — Other Ambulatory Visit: Payer: Self-pay | Admitting: Family Medicine

## 2020-08-21 DIAGNOSIS — I10 Essential (primary) hypertension: Secondary | ICD-10-CM

## 2020-08-30 DIAGNOSIS — H524 Presbyopia: Secondary | ICD-10-CM | POA: Diagnosis not present

## 2020-09-14 ENCOUNTER — Other Ambulatory Visit: Payer: Self-pay | Admitting: Family Medicine

## 2020-09-14 DIAGNOSIS — I10 Essential (primary) hypertension: Secondary | ICD-10-CM

## 2020-10-02 IMAGING — CT CT HEART MORP W/ CTA COR W/ SCORE W/ CA W/CM &/OR W/O CM
1 of 14 series · 3 of 20 positions shown, 4 images · non-contrast
Comparison: Chest radiograph 07/20/2008

Addendum:
CLINICAL DATA: chestpain

EXAM:
Cardiac/Coronary  CTA
TECHNIQUE: The patient was scanned on a Siemens Somatoform go.Top scanner.

[Series 44: ms multiphase cta coronary 0.60 · axial · 0.41mm/px · z∈[+1712,+1776]mm · 3 of 2889 slices shown, 4 images]
[im 723/2889  vessel]
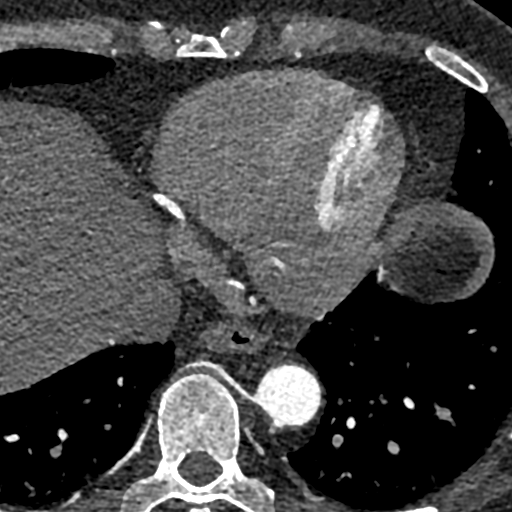
[im 723/2889  lung]
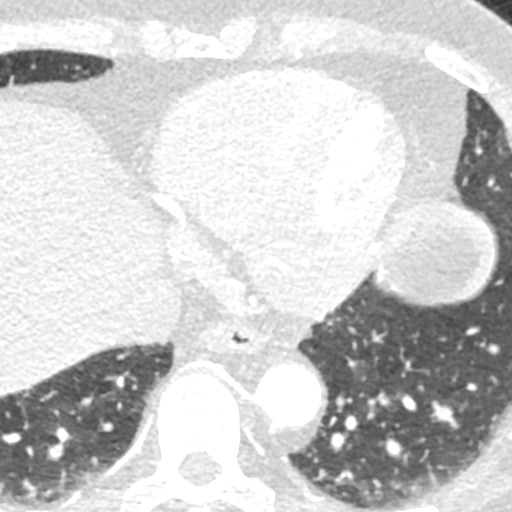
[im 1445/2889  vessel]
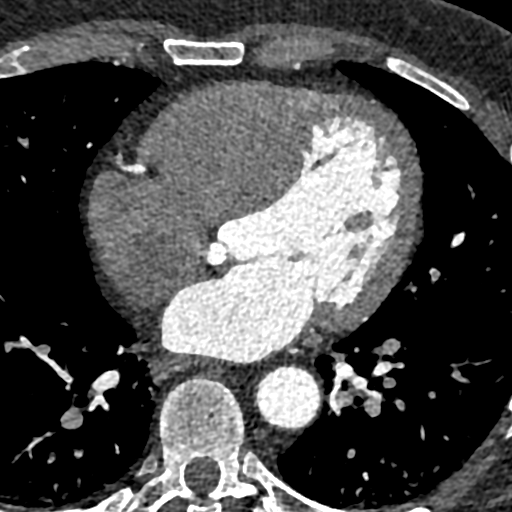
[im 2167/2889  vessel]
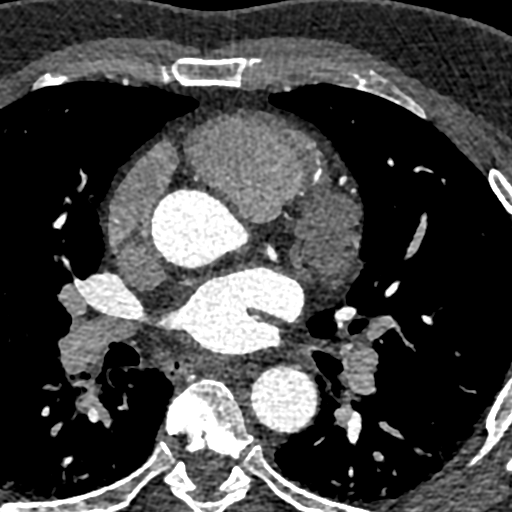

[3 of 20 positions shown; findings below may reference images not displayed]

FINDINGS: A retrospective scan was triggered in the descending thoracic aorta.
Axial non-contrast 3 mm slices were carried out through the heart.
The data set was analyzed on a dedicated work station and scored
using the Agatson method. Gantry rotation speed was 330 msecs and
collimation was .6 mm. 50mg of metoprolol and 0.8 mg of sl NTG was
given. The 3D data set was reconstructed in 5% intervals of the
50-95 % of the R-R cycle. Diastolic phases were analyzed on a
dedicated work station using MPR, MIP and VRT modes. The patient
received 85 cc of contrast.

Aorta: Normal size. Mild aortic root calcifications. No dissection.

Aortic Valve:  Trileaflet.  No calcifications.

Coronary Arteries:  Normal coronary origin.  Right dominance.

RCA is a large dominant artery that gives rise to PDA and PLA. There
is no plaque.

Left main is a large artery that gives rise to LAD and LCX arteries.

LAD is a large vessel that has no plaque.

LCX is a non-dominant artery that gives rise to two obtuse marginal
branches. There is no plaque.

Other findings:

Normal pulmonary vein drainage into the left atrium.

Normal left atrial appendage without a thrombus.

Normal size of the pulmonary artery.
IMPRESSION: 1. Low coronary calcium score of 8.29. This was 24th percentile for
age and sex matched control.

2. Normal coronary origin with right dominance.

3. No evidence of CAD.

4.  CAD-RADS 0. Consider non-atherosclerotic causes of chest pain.

ADDENDUM:
OVER-READ INTERPRETATION  CT CHEST

The following report is an over-read performed by radiologist Dr.
does not include interpretation of cardiac or coronary anatomy or
pathology. The coronary CTA interpretation by the cardiologist is
attached.
FINDINGS: No pleural fluid. 2 mm nodule along the right major fissure is
likely a subpleural lymph node. A 3 mm right lower lobe pulmonary
nodule on [DATE].

Normal aortic caliber. No central pulmonary embolism, on this
non-dedicated study.

No imaged thoracic adenopathy.

Normal imaged portions of the liver, spleen, stomach.

No acute osseous abnormality.
IMPRESSION: 1.  No acute findings in the imaged extracardiac chest.
2. Pulmonary nodules of maximally 3 mm. No follow-up needed if
patient is low-risk. Non-contrast chest CT can be considered in 12
months if patient is high-risk. This recommendation follows the
consensus statement: Guidelines for Management of Incidental
Pulmonary Nodules Detected on CT Images: From the [HOSPITAL]

*** End of Addendum ***
FINDINGS: A retrospective scan was triggered in the descending thoracic aorta.
Axial non-contrast 3 mm slices were carried out through the heart.
The data set was analyzed on a dedicated work station and scored
using the Agatson method. Gantry rotation speed was 330 msecs and
collimation was .6 mm. 50mg of metoprolol and 0.8 mg of sl NTG was
given. The 3D data set was reconstructed in 5% intervals of the
50-95 % of the R-R cycle. Diastolic phases were analyzed on a
dedicated work station using MPR, MIP and VRT modes. The patient
received 85 cc of contrast.

Aorta: Normal size. Mild aortic root calcifications. No dissection.

Aortic Valve:  Trileaflet.  No calcifications.

Coronary Arteries:  Normal coronary origin.  Right dominance.

RCA is a large dominant artery that gives rise to PDA and PLA. There
is no plaque.

Left main is a large artery that gives rise to LAD and LCX arteries.

LAD is a large vessel that has no plaque.

LCX is a non-dominant artery that gives rise to two obtuse marginal
branches. There is no plaque.

Other findings:

Normal pulmonary vein drainage into the left atrium.

Normal left atrial appendage without a thrombus.

Normal size of the pulmonary artery.
IMPRESSION: 1. Low coronary calcium score of 8.29. This was 24th percentile for
age and sex matched control.

2. Normal coronary origin with right dominance.

3. No evidence of CAD.

4.  CAD-RADS 0. Consider non-atherosclerotic causes of chest pain.

## 2020-10-22 ENCOUNTER — Other Ambulatory Visit: Payer: Self-pay | Admitting: *Deleted

## 2020-10-22 MED ORDER — ATORVASTATIN CALCIUM 40 MG PO TABS: 40.0000 mg | ORAL_TABLET | Freq: Every day | ORAL | 0 refills | Status: DC

## 2020-10-29 ENCOUNTER — Ambulatory Visit: Payer: Medicare HMO

## 2020-10-29 ENCOUNTER — Other Ambulatory Visit: Payer: Self-pay | Admitting: Family Medicine

## 2020-10-29 DIAGNOSIS — F411 Generalized anxiety disorder: Secondary | ICD-10-CM

## 2020-10-30 ENCOUNTER — Ambulatory Visit: Payer: Medicare HMO

## 2020-11-05 ENCOUNTER — Other Ambulatory Visit: Payer: Self-pay

## 2020-11-05 ENCOUNTER — Encounter: Payer: Self-pay | Admitting: Cardiology

## 2020-11-05 ENCOUNTER — Ambulatory Visit (INDEPENDENT_AMBULATORY_CARE_PROVIDER_SITE_OTHER): Payer: Medicare HMO | Admitting: Cardiology

## 2020-11-05 VITALS — BP 128/70 | HR 61 | Ht 72.0 in | Wt 226.0 lb

## 2020-11-05 DIAGNOSIS — I10 Essential (primary) hypertension: Secondary | ICD-10-CM

## 2020-11-05 DIAGNOSIS — E7800 Pure hypercholesterolemia, unspecified: Secondary | ICD-10-CM | POA: Diagnosis not present

## 2020-11-05 MED ORDER — ATORVASTATIN CALCIUM 40 MG PO TABS: 40.0000 mg | ORAL_TABLET | Freq: Every day | ORAL | 1 refills | Status: DC

## 2020-11-05 NOTE — Progress Notes (Signed)
Cardiology Office Note:    Date:  11/05/2020   ID:  Steven Russell, DOB 1951/03/06, MRN [id number]  PCP:  Steven Russell., MD   [place]  Cardiologist:  Kate Sable, MD  Advanced Practice Provider:  No care team member to display Electrophysiologist:  None       Referring MD: Steven Russell.,*   Chief Complaint  Patient presents with  . Other    7 month follow up. Meds reviewed verbally with patient.     History of Present Illness:    Steven Russell is a 70 y.o. male with a hx of tension, hyperlipidemia, GERD presenting for follow-up.  He is seen for hypertension and hyperlipidemia.  Previous work-up for chest pain was unrevealing from a cardiac perspective.  Echo was normal, coronary CTA with no evidence of obstructive CAD, calcium score was low at 8.29.  He feels well, denies chest pain or shortness of breath.  Obtain fasting lipid profile at primary care physician's office for months ago.  Currently planning a trip to [place] soon.  Prior notes Echo 12/2950 normal systolic and diastolic function, EF 55 to 60% Coronary CTA 02/2019 calcium score 8.29, no angiographic evidence of CAD.   Past Medical History:  Diagnosis Date  . Anxiety   . Depression   . GERD (gastroesophageal reflux disease)   . Hypertension   . Pre-diabetes   . Sleep apnea     Past Surgical History:  Procedure Laterality Date  . COLONOSCOPY WITH PROPOFOL N/A 07/12/2015   Procedure: COLONOSCOPY WITH PROPOFOL;  Surgeon: Steven Silvas, MD;  Location: [facility] ENDOSCOPY;  Service: Endoscopy;  Laterality: N/A;  . HERNIA REPAIR    . LUMBAR LAMINECTOMY/DECOMPRESSION MICRODISCECTOMY Left 04/25/2020   Procedure: Left Lumbar five- Sacral one Disectomy;  Surgeon: Steven Schools, MD;  Location: [facility];  Service: Orthopedics;  Laterality: Left;  2 hrs  . TONSILLECTOMY    . UPPER GI ENDOSCOPY  10/05/06   normal duodenum, reflux esophagitis and hiatus hernia  . VASECTOMY       Current Medications: Current Meds  Medication Sig  . chlorthalidone (HYGROTON) 25 MG tablet TAKE 1 TABLET (25 MG TOTAL) BY MOUTH DAILY.  Marland Kitchen esomeprazole (NEXIUM) 20 MG capsule Take 40 mg by mouth daily at 12 noon.   . sertraline (ZOLOFT) 50 MG tablet TAKE 1 AND 1/2 TABLETS BY MOUTH DAILY  . [DISCONTINUED] atorvastatin (LIPITOR) 40 MG tablet Take 1 tablet (40 mg total) by mouth daily.     Allergies:   Patient has no known allergies.   Social History   Socioeconomic History  . Marital status: Married    Spouse name: Not on file  . Number of children: 2  . Years of education: Not on file  . Highest education level: Bachelor's degree (e.g., BA, AB, BS)  Occupational History  . Occupation: retired  Tobacco Use  . Smoking status: Former Research scientist (life sciences)  . Smokeless tobacco: Never Used  . Tobacco comment: quit 35 years ago  Vaping Use  . Vaping Use: Never used  Substance and Sexual Activity  . Alcohol use: Yes    Alcohol/week: 1.0 standard drink    Types: 1 Cans of beer per week    Comment: wine or beer a glass EOD  . Drug use: No  . Sexual activity: Not on file  Other Topics Concern  . Not on file  Social History Narrative  . Not on file   Social Determinants of  Health   Financial Resource Strain: Not on file  Food Insecurity: Not on file  Transportation Needs: Not on file  Physical Activity: Not on file  Stress: Not on file  Social Connections: Not on file     Family History: The patient's family history includes Brain cancer in his paternal grandfather; Breast cancer in his mother; Diabetes in his daughter and sister; Glaucoma in his mother; Lung cancer in his father; Melanoma in his sister.  ROS:   Please see the history of present illness.     All other systems reviewed and are negative.  EKGs/Labs/Other Studies Reviewed:    The following studies were reviewed today:   EKG:  EKG is  ordered today.  The ekg ordered today demonstrates sinus rhythm, occasional  PVC's  Recent Labs: 06/13/2020: ALT 17; BUN 18; Creatinine, Ser 0.89; Hemoglobin 11.8; Platelets 150; Potassium 4.6; Sodium 140; TSH 2.170  Recent Lipid Panel    Component Value Date/Time   CHOL 131 06/13/2020 0926   TRIG 83 06/13/2020 0926   HDL 34 (L) 06/13/2020 0926   CHOLHDL 3.9 06/13/2020 0926   LDLCALC 81 06/13/2020 0926     Risk Assessment/Calculations:      Physical Exam:    VS:  BP 128/70 (BP Location: Left Arm, Patient Position: Sitting, Cuff Size: Normal)   Pulse 61   Ht 6' (1.829 m)   Wt 226 lb (102.5 kg)   SpO2 96%   BMI 30.65 kg/m     Wt Readings from Last 3 Encounters:  11/05/20 226 lb (102.5 kg)  06/13/20 234 lb (106.1 kg)  04/23/20 223 lb 8 oz (101.4 kg)     GEN:  Well nourished, well developed in no acute distress HEENT: Normal NECK: No JVD; No carotid bruits LYMPHATICS: No lymphadenopathy CARDIAC: RRR, no murmurs, rubs, gallops RESPIRATORY:  Clear to auscultation without rales, wheezing or rhonchi  ABDOMEN: Soft, non-tender, non-distended MUSCULOSKELETAL:  No edema; No deformity  SKIN: Warm and dry NEUROLOGIC:  Alert and oriented x 3 PSYCHIATRIC:  Normal affect   ASSESSMENT:    1. Essential hypertension   2. Pure hypercholesterolemia    PLAN:    In order of problems listed above:  1. Hypertension, BP controlled, continue chlorthalidone. 2. Hyperlipidemia, cholesterol controlled.  Continue Lipitor 40 mg daily.  90-day refill sent.  Patient is otherwise stable from a cardiac perspective, no active cardiac issues currently.  Follow-up as needed   Medication Adjustments/Labs and Tests Ordered: Current medicines are reviewed at length with the patient today.  Concerns regarding medicines are outlined above.  Orders Placed This Encounter  Procedures  . EKG 12-Lead   Meds ordered this encounter  Medications  . atorvastatin (LIPITOR) 40 MG tablet    Sig: Take 1 tablet (40 mg total) by mouth daily.    Dispense:  90 tablet    Refill:  1     Patient Instructions  Medication Instructions:  Your physician recommends that you continue on your current medications as directed. Please refer to the Current Medication list given to you today.  *If you need a refill on your cardiac medications before your next appointment, please call your pharmacy*   Lab Work: None ordered If you have labs (blood work) drawn today and your tests are completely normal, you will receive your results only by: Marland Kitchen MyChart Message (if you have MyChart) OR . A paper copy in the mail If you have any lab test that is abnormal or we need to change your  treatment, we will call you to review the results.   Testing/Procedures: None ordered   Follow-Up: At [facility], you and your health needs are our priority.  As part of our continuing mission to provide you with exceptional heart care, we have created designated Provider Care Teams.  These Care Teams include your primary Cardiologist (physician) and Advanced Practice Providers (APPs -  Physician Assistants and Nurse Practitioners) who all work together to provide you with the care you need, when you need it.  We recommend signing up for the patient portal called "MyChart".  Sign up information is provided on this After Visit Summary.  MyChart is used to connect with patients for Virtual Visits (Telemedicine).  Patients are able to view lab/test results, encounter notes, upcoming appointments, etc.  Non-urgent messages can be sent to your provider as well.   To learn more about what you can do with MyChart, go to [email or web address].    Your next appointment:   Follow up as needed   The format for your next appointment:   In Person  Provider:   Kate Sable, MD   Other Instructions      Signed, Kate Sable, MD  11/05/2020 12:24 PM    [place]

## 2020-11-05 NOTE — Patient Instructions (Signed)

## 2020-11-12 ENCOUNTER — Encounter: Payer: Self-pay | Admitting: Family Medicine

## 2020-11-29 ENCOUNTER — Telehealth: Payer: Self-pay

## 2020-11-29 NOTE — Telephone Encounter (Signed)
Advised patient that we will do AWV along with physical. Wellness nurse is no longer here and it had to be canceled.

## 2020-11-29 NOTE — Telephone Encounter (Signed)
Copied from [place] [phone]. Topic: Appointment Scheduling - Scheduling Inquiry for Clinic >> Nov 29, 2020 11:12 AM Pawlus, Apolonio Schneiders wrote: Reason for CRM: Pt has an upcoming CPE on 5/24, pt stated he never had his AWV appt (stated on appt note that it was done 10/30/20 but it was actually cancelled). Pt wanted to know if he still needs to have his AWV appt, please advise.

## 2020-12-04 ENCOUNTER — Other Ambulatory Visit: Payer: Self-pay

## 2020-12-04 ENCOUNTER — Ambulatory Visit (INDEPENDENT_AMBULATORY_CARE_PROVIDER_SITE_OTHER): Payer: Medicare HMO | Admitting: Family Medicine

## 2020-12-04 ENCOUNTER — Encounter: Payer: Self-pay | Admitting: Family Medicine

## 2020-12-04 VITALS — BP 137/78 | HR 61 | Temp 98.4°F | Resp 16 | Ht 72.0 in | Wt 225.0 lb

## 2020-12-04 DIAGNOSIS — R739 Hyperglycemia, unspecified: Secondary | ICD-10-CM | POA: Diagnosis not present

## 2020-12-04 DIAGNOSIS — Z125 Encounter for screening for malignant neoplasm of prostate: Secondary | ICD-10-CM

## 2020-12-04 DIAGNOSIS — E7800 Pure hypercholesterolemia, unspecified: Secondary | ICD-10-CM | POA: Diagnosis not present

## 2020-12-04 DIAGNOSIS — F411 Generalized anxiety disorder: Secondary | ICD-10-CM

## 2020-12-04 DIAGNOSIS — I10 Essential (primary) hypertension: Secondary | ICD-10-CM | POA: Diagnosis not present

## 2020-12-04 DIAGNOSIS — Z1211 Encounter for screening for malignant neoplasm of colon: Secondary | ICD-10-CM

## 2020-12-04 DIAGNOSIS — R69 Illness, unspecified: Secondary | ICD-10-CM | POA: Diagnosis not present

## 2020-12-04 DIAGNOSIS — D649 Anemia, unspecified: Secondary | ICD-10-CM | POA: Diagnosis not present

## 2020-12-04 DIAGNOSIS — Z0000 Encounter for general adult medical examination without abnormal findings: Secondary | ICD-10-CM | POA: Diagnosis not present

## 2020-12-04 DIAGNOSIS — R5383 Other fatigue: Secondary | ICD-10-CM

## 2020-12-04 LAB — IFOBT (OCCULT BLOOD): IFOBT: NEGATIVE

## 2020-12-04 MED ORDER — ATORVASTATIN CALCIUM 40 MG PO TABS: 40.0000 mg | ORAL_TABLET | Freq: Every day | ORAL | 3 refills | Status: DC

## 2020-12-04 MED ORDER — SERTRALINE HCL 50 MG PO TABS: ORAL_TABLET | ORAL | 3 refills | Status: DC

## 2020-12-04 NOTE — Patient Instructions (Signed)
Stop Chlorthalidone. Check BP once a week at home.

## 2020-12-04 NOTE — Progress Notes (Signed)
I,April Miller,acting as a scribe for Wilhemena Durie, MD.,have documented all relevant documentation on the behalf of Wilhemena Durie, MD,as directed by  Wilhemena Durie, MD while in the presence of Wilhemena Durie, MD.   Annual Wellness Visit     Patient: Steven Russell, Male    DOB: 28-Jul-1950, 70 y.o.   MRN: [id number] Visit Date: 12/04/2020  Today's Provider: Wilhemena Durie, MD   Chief Complaint  Patient presents with  . Medicare Wellness   Subjective    Steven Russell is a 70 y.o. male who presents today for his Annual Wellness Visit. He reports consuming a general diet. The patient does not participate in regular exercise at present. He generally feels well. He reports sleeping well. He does not have additional problems to discuss today.   HPI Patient complains of mild increase in lethargy and trouble paying attention over the past year. No complaints that are more specific.      Medications: Outpatient Medications Prior to Visit  Medication Sig  . aspirin EC 81 MG tablet Take 81 mg by mouth daily. Swallow whole.  Marland Kitchen atorvastatin (LIPITOR) 40 MG tablet Take 1 tablet (40 mg total) by mouth daily.  . chlorthalidone (HYGROTON) 25 MG tablet TAKE 1 TABLET (25 MG TOTAL) BY MOUTH DAILY.  Marland Kitchen esomeprazole (NEXIUM) 20 MG capsule Take 40 mg by mouth daily at 12 noon.   . sertraline (ZOLOFT) 50 MG tablet TAKE 1 AND 1/2 TABLETS BY MOUTH DAILY   No facility-administered medications prior to visit.    No Known Allergies  Patient Care Team: Jerrol Banana., MD as PCP - General (Family Medicine) Kate Sable, MD as PCP - Cardiology (Cardiology) Dasher, Rayvon Char, MD (Dermatology)  Review of Systems  Constitutional: Positive for fatigue.  HENT: Positive for tinnitus.   Respiratory: Positive for apnea.   Psychiatric/Behavioral: Positive for decreased concentration.  All other systems reviewed and are negative.        Objective    Vitals: BP  137/78 (BP Location: Right Arm, Patient Position: Sitting, Cuff Size: Large)   Pulse 61   Temp 98.4 F (36.9 C) (Oral)   Resp 16   Ht 6' (1.829 m)   Wt 225 lb (102.1 kg)   SpO2 96%   BMI 30.52 kg/m  BP Readings from Last 3 Encounters:  12/04/20 137/78  11/05/20 128/70  06/13/20 119/66   Wt Readings from Last 3 Encounters:  12/04/20 225 lb (102.1 kg)  11/05/20 226 lb (102.5 kg)  06/13/20 234 lb (106.1 kg)      Physical Exam Vitals reviewed.  Constitutional:      Appearance: Normal appearance. He is normal weight.  HENT:     Head: Normocephalic and atraumatic.     Right Ear: Tympanic membrane, ear canal and external ear normal.     Left Ear: Tympanic membrane, ear canal and external ear normal.     Nose: Nose normal.     Mouth/Throat:     Mouth: Mucous membranes are moist.     Pharynx: Oropharynx is clear.  Eyes:     Extraocular Movements: Extraocular movements intact.     Conjunctiva/sclera: Conjunctivae normal.     Pupils: Pupils are equal, round, and reactive to light.  Cardiovascular:     Rate and Rhythm: Normal rate and regular rhythm.     Pulses: Normal pulses.     Heart sounds: Murmur heard.      Comments: 1/6 to 2/6  soft systolic murmur over right upper sternal border Pulmonary:     Effort: Pulmonary effort is normal.     Breath sounds: Normal breath sounds.  Abdominal:     General: Abdomen is flat. Bowel sounds are normal.     Palpations: Abdomen is soft.  Genitourinary:    Penis: Normal.      Testes: Normal.     Prostate: Normal.     Rectum: Normal.  Musculoskeletal:        General: Normal range of motion.     Cervical back: Normal range of motion and neck supple.  Skin:    General: Skin is warm and dry.  Neurological:     General: No focal deficit present.     Mental Status: He is alert and oriented to person, place, and time. Mental status is at baseline.  Psychiatric:        Mood and Affect: Mood normal.        Behavior: Behavior normal.         Thought Content: Thought content normal.        Judgment: Judgment normal.      Most recent functional status assessment: In your present state of health, do you have any difficulty performing the following activities: 12/04/2020  Hearing? N  Vision? N  Difficulty concentrating or making decisions? N  Walking or climbing stairs? N  Dressing or bathing? N  Doing errands, shopping? N  Some recent data might be hidden   Most recent fall risk assessment: Fall Risk  12/04/2020  Falls in the past year? 0  Number falls in past yr: 0  Injury with Fall? 0  Follow up Falls evaluation completed    Most recent depression screenings: PHQ 2/9 Scores 12/04/2020 04/12/2020  PHQ - 2 Score 1 1  PHQ- 9 Score 4 4   Most recent cognitive screening: 6CIT Screen 10/25/2019  What Year? 0 points  What month? 0 points  What time? 0 points  Count back from 20 0 points  Months in reverse 0 points  Repeat phrase 0 points  Total Score 0   Most recent Audit-C alcohol use screening Alcohol Use Disorder Test (AUDIT) 12/04/2020  1. How often do you have a drink containing alcohol? 3  2. How many drinks containing alcohol do you have on a typical day when you are drinking? 0  3. How often do you have six or more drinks on one occasion? 0  AUDIT-C Score 3  4. How often during the last year have you found that you were not able to stop drinking once you had started? 0  5. How often during the last year have you failed to do what was normally expected from you because of drinking? 0  6. How often during the last year have you needed a first drink in the morning to get yourself going after a heavy drinking session? 0  7. How often during the last year have you had a feeling of guilt of remorse after drinking? 0  8. How often during the last year have you been unable to remember what happened the night before because you had been drinking? 0  9. Have you or someone else been injured as a result of your  drinking? 0  10. Has a relative or friend or a doctor or another health worker been concerned about your drinking or suggested you cut down? 0  Alcohol Use Disorder Identification Test Final Score (AUDIT) 3  Alcohol Brief Interventions/Follow-up -  A score of 3 or more in women, and 4 or more in men indicates increased risk for alcohol abuse, EXCEPT if all of the points are from question 1   No results found for any visits on 12/04/20.  Assessment & Plan     Annual wellness visit done today including the all of the following: Reviewed patient's Family Medical History Reviewed and updated list of patient's medical providers Assessment of cognitive impairment was done Assessed patient's functional ability Established a written schedule for health screening [place] Completed and Reviewed  Exercise Activities and Dietary recommendations Goals    . DIET - INCREASE WATER INTAKE     Recommend to drink at least 6-8 8oz glasses of water per day.       Immunization History  Administered Date(s) Administered  . Fluad Quad(high Dose 65+) 04/12/2020  . Hepatitis A, Adult 07/04/2016  . Influenza, High Dose Seasonal PF 07/04/2019  . Influenza,inj,Quad PF,6+ Mos 06/20/2015, 06/23/2016  . Influenza-Unspecified 06/13/2018  . PFIZER(Purple Top)SARS-COV-2 Vaccination 08/25/2019, 09/15/2019  . Pneumococcal Conjugate-13 07/23/2017  . Pneumococcal Polysaccharide-23 08/19/2018  . Td 03/25/2004  . Tdap 03/13/2014  . Zoster 12/07/2012  . Zoster Recombinat (Shingrix) 02/17/2019    Health Maintenance  Topic Date Due  . COVID-19 Vaccine (4 - Booster for Pfizer series) 09/11/2020  . INFLUENZA VACCINE  02/11/2021  . TETANUS/TDAP  03/13/2024  . COLONOSCOPY (Pts 45-33yrs Insurance coverage will need to be confirmed)  07/11/2025  . Hepatitis C Screening  Completed  . PNA vac Low Risk Adult  Completed  . HPV VACCINES  Aged Out     Discussed health benefits of physical  activity, and encouraged him to engage in regular exercise appropriate for his age and condition  1. Medicare annual wellness visit, subsequent   2. Annual physical exam   3. Primary hypertension  - Comprehensive metabolic panel  4. Hypercholesteremia  - Lipid panel - atorvastatin (LIPITOR) 40 MG tablet; Take 1 tablet (40 mg total) by mouth daily.  Dispense: 90 tablet; Refill: 3  5. Fatigue, unspecified type Due to lethargy with a change lab data.  Stop chlorthalidone at this point.  Weekly blood pressure at home and follow-up in 1 to 2 months.  May consider stopping atorvastatin in the future to see if this could be the etiology of this lethargy - Testosterone - TSH  6. Anemia, unspecified type  - CBC with Differential/Platelet  7. Prostate cancer screening  - PSA  8. Hyperglycemia  - Hemoglobin A1c  9. Anxiety, generalized  - sertraline (ZOLOFT) 50 MG tablet; TAKE 1 AND 1/2 TABLETS BY MOUTH DAILY  Dispense: 135 tablet; Refill: 3  10. Encounter for screening fecal occult blood testing  - IFOBT POC (occult bld, rslt in office); Future - IFOBT POC (occult bld, rslt in office)   No follow-ups on file.     I, Wilhemena Durie, MD, have reviewed all documentation for this visit. The documentation on 12/10/20 for the exam, diagnosis, procedures, and orders are all accurate and complete.    Sabas Frett Cranford Mon, MD  [facility] [phone] (phone) [phone] (fax)  [place]

## 2020-12-05 LAB — COMPREHENSIVE METABOLIC PANEL
ALT: 12 IU/L (ref 0–44)
AST: 17 IU/L (ref 0–40)
Albumin/Globulin Ratio: 1.7 (ref 1.2–2.2)
Albumin: 4.3 g/dL (ref 3.8–4.8)
Alkaline Phosphatase: 108 IU/L (ref 44–121)
BUN/Creatinine Ratio: 13 (ref 10–24)
BUN: 15 mg/dL (ref 8–27)
Bilirubin Total: 0.5 mg/dL (ref 0.0–1.2)
CO2: 27 mmol/L (ref 20–29)
Calcium: 9.5 mg/dL (ref 8.6–10.2)
Chloride: 99 mmol/L (ref 96–106)
Creatinine, Ser: 1.12 mg/dL (ref 0.76–1.27)
Globulin, Total: 2.5 g/dL (ref 1.5–4.5)
Glucose: 119 mg/dL — ABNORMAL HIGH (ref 65–99)
Potassium: 3.6 mmol/L (ref 3.5–5.2)
Sodium: 141 mmol/L (ref 134–144)
Total Protein: 6.8 g/dL (ref 6.0–8.5)
eGFR: 72 mL/min/{1.73_m2} (ref >59)

## 2020-12-05 LAB — CBC WITH DIFFERENTIAL/PLATELET
Basophils Absolute: 0 10*3/uL (ref 0.0–0.2)
Basos: 1 %
EOS (ABSOLUTE): 0.1 10*3/uL (ref 0.0–0.4)
Eos: 1 %
Hematocrit: 36.7 % — ABNORMAL LOW (ref 37.5–51.0)
Hemoglobin: 12.2 g/dL — ABNORMAL LOW (ref 13.0–17.7)
Immature Grans (Abs): 0 10*3/uL (ref 0.0–0.1)
Immature Granulocytes: 1 %
Lymphocytes Absolute: 1 10*3/uL (ref 0.7–3.1)
Lymphs: 20 %
MCH: 28.3 pg (ref 26.6–33.0)
MCHC: 33.2 g/dL (ref 31.5–35.7)
MCV: 85 fL (ref 79–97)
Monocytes Absolute: 0.5 10*3/uL (ref 0.1–0.9)
Monocytes: 9 %
Neutrophils Absolute: 3.4 10*3/uL (ref 1.4–7.0)
Neutrophils: 68 %
Platelets: 149 10*3/uL — ABNORMAL LOW (ref 150–450)
RBC: 4.31 x10E6/uL (ref 4.14–5.80)
RDW: 13.1 % (ref 11.6–15.4)
WBC: 5 10*3/uL (ref 3.4–10.8)

## 2020-12-05 LAB — LIPID PANEL
Chol/HDL Ratio: 3.9 ratio (ref 0.0–5.0)
Cholesterol, Total: 129 mg/dL (ref 100–199)
HDL: 33 mg/dL — ABNORMAL LOW (ref >39)
LDL Chol Calc (NIH): 75 mg/dL (ref 0–99)
Triglycerides: 113 mg/dL (ref 0–149)
VLDL Cholesterol Cal: 21 mg/dL (ref 5–40)

## 2020-12-05 LAB — HEMOGLOBIN A1C
Est. average glucose Bld gHb Est-mCnc: 128 mg/dL
Hgb A1c MFr Bld: 6.1 % — ABNORMAL HIGH (ref 4.8–5.6)

## 2020-12-05 LAB — PSA: Prostate Specific Ag, Serum: 1.1 ng/mL (ref 0.0–4.0)

## 2020-12-05 LAB — TSH: TSH: 2.66 u[IU]/mL (ref 0.450–4.500)

## 2020-12-05 LAB — TESTOSTERONE: Testosterone: 598 ng/dL (ref 264–916)

## 2020-12-17 DIAGNOSIS — M5459 Other low back pain: Secondary | ICD-10-CM | POA: Diagnosis not present

## 2020-12-20 DIAGNOSIS — S39012D Strain of muscle, fascia and tendon of lower back, subsequent encounter: Secondary | ICD-10-CM | POA: Diagnosis not present

## 2020-12-20 DIAGNOSIS — M5459 Other low back pain: Secondary | ICD-10-CM | POA: Diagnosis not present

## 2020-12-24 DIAGNOSIS — M5459 Other low back pain: Secondary | ICD-10-CM | POA: Diagnosis not present

## 2020-12-24 DIAGNOSIS — S39012D Strain of muscle, fascia and tendon of lower back, subsequent encounter: Secondary | ICD-10-CM | POA: Diagnosis not present

## 2020-12-26 DIAGNOSIS — M5459 Other low back pain: Secondary | ICD-10-CM | POA: Diagnosis not present

## 2020-12-26 DIAGNOSIS — S39012D Strain of muscle, fascia and tendon of lower back, subsequent encounter: Secondary | ICD-10-CM | POA: Diagnosis not present

## 2020-12-28 DIAGNOSIS — M5459 Other low back pain: Secondary | ICD-10-CM | POA: Diagnosis not present

## 2020-12-28 DIAGNOSIS — S39012D Strain of muscle, fascia and tendon of lower back, subsequent encounter: Secondary | ICD-10-CM | POA: Diagnosis not present

## 2021-02-06 NOTE — Progress Notes (Signed)
I,April Miller,acting as a scribe for Wilhemena Durie, MD.,have documented all relevant documentation on the behalf of Wilhemena Durie, MD,as directed by  Wilhemena Durie, MD while in the presence of Wilhemena Durie, MD.   Established patient visit   Patient: Steven Russell   DOB: 03-13-1951   70 y.o. Male  MRN: [id number] Visit Date: 02/07/2021  Today's healthcare provider: Wilhemena Durie, MD   Chief Complaint  Patient presents with   Follow-up   Fatigue   Subjective    HPI  Patient comes in today for follow-up.  He is off clear chlorthalidone and feels better.  He had swelling with amlodipine.  Blood pressure at home runs 140s over 80s. Follow up for Fatigue  The patient was last seen for this 2 months ago. Changes made at last visit include; Due to lethargy with a change lab data.  Stop chlorthalidone at this point.  Weekly blood pressure at home and follow-up in 1 to 2 months.  May consider stopping atorvastatin in the future to see if this could be the etiology of this lethargy.  He reports good compliance with treatment. He feels that condition is Improved. He is not having side effects. none  ----------------------------------------------------------------------------       Medications: Outpatient Medications Prior to Visit  Medication Sig   aspirin EC 81 MG tablet Take 81 mg by mouth daily. Swallow whole.   atorvastatin (LIPITOR) 40 MG tablet Take 1 tablet (40 mg total) by mouth daily.   esomeprazole (NEXIUM) 20 MG capsule Take 40 mg by mouth daily at 12 noon.    sertraline (ZOLOFT) 50 MG tablet TAKE 1 AND 1/2 TABLETS BY MOUTH DAILY   chlorthalidone (HYGROTON) 25 MG tablet TAKE 1 TABLET (25 MG TOTAL) BY MOUTH DAILY. (Patient not taking: Reported on 02/07/2021)   No facility-administered medications prior to visit.    Review of Systems  Constitutional:  Negative for appetite change, chills and fever.  Respiratory:  Negative for chest  tightness, shortness of breath and wheezing.   Cardiovascular:  Negative for chest pain and palpitations.  Gastrointestinal:  Negative for abdominal pain, nausea and vomiting.       Objective    BP 137/80 (BP Location: Left Arm, Patient Position: Sitting, Cuff Size: Large)   Pulse (!) 56   Resp 16   Ht 6' (1.829 m)   Wt 229 lb (103.9 kg)   SpO2 96%   BMI 31.06 kg/m  BP Readings from Last 3 Encounters:  02/07/21 137/80  12/04/20 137/78  11/05/20 128/70   Wt Readings from Last 3 Encounters:  02/07/21 229 lb (103.9 kg)  12/04/20 225 lb (102.1 kg)  11/05/20 226 lb (102.5 kg)       Physical Exam Vitals reviewed.  Constitutional:      Appearance: He is well-developed.  HENT:     Head: Normocephalic and atraumatic.  Eyes:     General: No scleral icterus.    Conjunctiva/sclera: Conjunctivae normal.  Cardiovascular:     Rate and Rhythm: Normal rate and regular rhythm.     Comments: No carotid bruit Pulmonary:     Effort: Pulmonary effort is normal.     Breath sounds: Normal breath sounds.  Abdominal:     Palpations: Abdomen is soft.  Musculoskeletal:     Right lower leg: No edema.     Left lower leg: No edema.  Skin:    General: Skin is warm and dry.  Neurological:  General: No focal deficit present.     Mental Status: He is oriented to person, place, and time.  Psychiatric:        Mood and Affect: Mood normal.        Behavior: Behavior normal.        Thought Content: Thought content normal.        Judgment: Judgment normal.      No results found for any visits on 02/07/21.  Assessment & Plan     1. Primary hypertension Started losartan 50 mg daily.  Follow-up 2 to 3 months. - losartan (COZAAR) 50 MG tablet; Take 1 tablet (50 mg total) by mouth daily.  Dispense: 30 tablet; Refill: 2  2. Fatigue, unspecified type    Return in about 3 months (around 05/10/2021).      I, Wilhemena Durie, MD, have reviewed all documentation for this visit. The  documentation on 02/23/21 for the exam, diagnosis, procedures, and orders are all accurate and complete.    Leidi Astle Cranford Mon, MD  [facility] [phone] (phone) [phone] (fax)  [place]

## 2021-02-07 ENCOUNTER — Ambulatory Visit (INDEPENDENT_AMBULATORY_CARE_PROVIDER_SITE_OTHER): Payer: Medicare HMO | Admitting: Family Medicine

## 2021-02-07 ENCOUNTER — Other Ambulatory Visit: Payer: Self-pay

## 2021-02-07 ENCOUNTER — Encounter: Payer: Self-pay | Admitting: Family Medicine

## 2021-02-07 VITALS — BP 137/80 | HR 56 | Resp 16 | Ht 72.0 in | Wt 229.0 lb

## 2021-02-07 DIAGNOSIS — R5383 Other fatigue: Secondary | ICD-10-CM

## 2021-02-07 DIAGNOSIS — I10 Essential (primary) hypertension: Secondary | ICD-10-CM

## 2021-02-07 MED ORDER — LOSARTAN POTASSIUM 50 MG PO TABS: 50.0000 mg | ORAL_TABLET | Freq: Every day | ORAL | 2 refills | Status: DC

## 2021-02-18 DIAGNOSIS — Z4889 Encounter for other specified surgical aftercare: Secondary | ICD-10-CM | POA: Diagnosis not present

## 2021-04-07 ENCOUNTER — Ambulatory Visit
Admission: RE | Admit: 2021-04-07 | Discharge: 2021-04-07 | Disposition: A | Discharge status: Home / Self Care | Payer: Medicare HMO | Source: Ambulatory Visit | Attending: Orthopedic Surgery | Admitting: Orthopedic Surgery

## 2021-04-07 ENCOUNTER — Other Ambulatory Visit: Payer: Self-pay

## 2021-04-07 VITALS — BP 149/87 | HR 82 | Temp 100.5°F | Resp 18

## 2021-04-07 DIAGNOSIS — R112 Nausea with vomiting, unspecified: Secondary | ICD-10-CM

## 2021-04-07 DIAGNOSIS — U071 COVID-19: Secondary | ICD-10-CM | POA: Diagnosis not present

## 2021-04-07 MED ORDER — ONDANSETRON 4 MG PO TBDP: 4.0000 mg | ORAL_TABLET | Freq: Three times a day (TID) | ORAL | 0 refills | Status: DC | PRN

## 2021-04-07 MED ORDER — NIRMATRELVIR/RITONAVIR (PAXLOVID)TABLET: 3.0000 | ORAL_TABLET | Freq: Two times a day (BID) | ORAL | 0 refills | Status: AC

## 2021-04-07 MED ORDER — ONDANSETRON 4 MG PO TBDP
4.0000 mg | ORAL_TABLET | Freq: Once | ORAL | Status: AC
  Administered 2021-04-07: 4 mg via ORAL

## 2021-04-07 NOTE — Discharge Instructions (Addendum)
Please make sure you are drinking lots of fluids.  Take Tylenol 6 hours for chills, body aches and fevers.  Use Zofran as needed for nausea.  If you develop any worsening symptoms, vomiting unable to keep things down please feel free to ER for further evaluation.

## 2021-04-07 NOTE — ED Triage Notes (Addendum)
Pt Covid positive with headache, nasal congestion, fever and body aches since yesterday.

## 2021-04-07 NOTE — ED Provider Notes (Signed)
[facility]    CSN: [id number] Arrival date & time: 04/07/21  1159      History   Chief Complaint Chief Complaint  Patient presents with   Generalized Body Aches   Fever   Headache    HPI NOBLE CICALESE is a 70 y.o. male presents for evaluation of fever, chills headache and body aches.  Symptoms began yesterday or today.  He also and vomiting here frequently.  No complaints of vomiting.  Patient was given some Zofran able tolerate fluids.  Patient updated on COVID test yesterday.  COVID test was positive.  He has been around someone with COVID recently.  He denies any shortness of breath.    HPI  Past Medical History:  Diagnosis Date   Anxiety    Depression    GERD (gastroesophageal reflux disease)    Hypertension    Pre-diabetes    Sleep apnea     Patient Active Problem List   Diagnosis Date Noted   Allergic rhinitis 06/14/2015   Basal cell carcinoma of skin 06/14/2015   Clinical depression 06/14/2015   DD (diverticular disease) 06/14/2015   Anxiety, generalized 06/14/2015   Acid reflux 06/14/2015   Hypercholesteremia 06/14/2015   Cannot sleep 06/14/2015   Arthritis, degenerative 06/14/2015   Avitaminosis D 06/14/2015    Past Surgical History:  Procedure Laterality Date   COLONOSCOPY WITH PROPOFOL N/A 07/12/2015   Procedure: COLONOSCOPY WITH PROPOFOL;  Surgeon: Manya Silvas, MD;  Location: [facility];  Service: Endoscopy;  Laterality: N/A;   HERNIA REPAIR     LUMBAR LAMINECTOMY/DECOMPRESSION MICRODISCECTOMY Left 04/25/2020   Procedure: Left Lumbar five- Sacral one Disectomy;  Surgeon: Melina Schools, MD;  Location: [place];  Service: Orthopedics;  Laterality: Left;  2 hrs   TONSILLECTOMY     UPPER GI ENDOSCOPY  10/05/06   normal duodenum, reflux esophagitis and hiatus hernia   VASECTOMY         Home Medications    Prior to Admission medications   Medication Sig Start Date End Date Taking? Authorizing Provider   nirmatrelvir/ritonavir EUA (PAXLOVID) 20 x 150 MG & 10 x 100MG  TABS Take 3 tablets by mouth 2 (two) times daily for 5 days. Patient GFR is 70. Take nirmatrelvir (150 mg) two tablets twice daily for 5 days and ritonavir (100 mg) one tablet twice daily for 5 days. 04/07/21 04/12/21 Yes Duanne Guess, PA-C  ondansetron (ZOFRAN ODT) 4 MG disintegrating tablet Take 1 tablet (4 mg total) by mouth every 8 (eight) hours as needed for nausea or vomiting. 04/07/21  Yes Duanne Guess, PA-C  aspirin EC 81 MG tablet Take 81 mg by mouth daily. Swallow whole.    [provider]  atorvastatin (LIPITOR) 40 MG tablet Take 1 tablet (40 mg total) by mouth daily. 12/04/20   Jerrol Banana., MD  chlorthalidone (HYGROTON) 25 MG tablet TAKE 1 TABLET (25 MG TOTAL) BY MOUTH DAILY. Patient not taking: Reported on 02/07/2021 09/14/20   Jerrol Banana., MD  esomeprazole (NEXIUM) 20 MG capsule Take 40 mg by mouth daily at 12 noon.  06/15/13   [provider]  losartan (COZAAR) 50 MG tablet Take 1 tablet (50 mg total) by mouth daily. 02/07/21   Jerrol Banana., MD  sertraline (ZOLOFT) 50 MG tablet TAKE 1 AND 1/2 TABLETS BY MOUTH DAILY 12/04/20   Jerrol Banana., MD    Family History Family History  Problem Relation Age of Onset  Breast cancer Mother    Glaucoma Mother    Lung cancer Father    Diabetes Sister    Diabetes Daughter    Melanoma Sister    Brain cancer Paternal Grandfather     Social History Social History   Tobacco Use   Smoking status: Former   Smokeless tobacco: Never   Tobacco comments:    quit 35 years ago  Vaping Use   Vaping Use: Never used  Substance Use Topics   Alcohol use: Yes    Alcohol/week: 1.0 standard drink    Types: 1 Cans of beer per week    Comment: wine or beer a glass EOD   Drug use: No     Allergies   Patient has no known allergies.   Review of Systems Review of Systems  Constitutional:  Positive for chills, fatigue and  fever.  Respiratory:  Negative for cough and shortness of breath.   Gastrointestinal:  Positive for nausea and vomiting. Negative for abdominal pain.  Musculoskeletal:  Positive for myalgias.  Skin:  Negative for rash.  Neurological:  Positive for headaches. Negative for light-headedness.    Physical Exam Triage Vital Signs ED Triage Vitals  Enc Vitals Group     BP 04/07/21 1224 (!) 149/87     Pulse Rate 04/07/21 1224 82     Resp 04/07/21 1224 18     Temp 04/07/21 1224 (!) 100.5 F (38.1 C)     Temp Source 04/07/21 1224 Oral     SpO2 04/07/21 1224 96 %     Weight --      Height --      Head Circumference --      Peak Flow --      Pain Score 04/07/21 1225 4     Pain Loc --      Pain Edu? --      Excl. in [place]? --    No data found.  Updated Vital Signs BP (!) 149/87   Pulse 82   Temp (!) 100.5 F (38.1 C) (Oral)   Resp 18   SpO2 96%   Visual Acuity Right Eye Distance:   Left Eye Distance:   Bilateral Distance:    Right Eye Near:   Left Eye Near:    Bilateral Near:     Physical Exam Constitutional:      General: He is not in acute distress.    Appearance: He is well-developed.  HENT:     Head: Normocephalic and atraumatic.     Jaw: No trismus.     Right Ear: Hearing, tympanic membrane, ear canal and external ear normal.     Left Ear: Hearing, tympanic membrane, ear canal and external ear normal.     Nose: Rhinorrhea present.     Right Sinus: No maxillary sinus tenderness or frontal sinus tenderness.     Left Sinus: No maxillary sinus tenderness or frontal sinus tenderness.     Mouth/Throat:     Mouth: Mucous membranes are moist.     Pharynx: Oropharynx is clear. No oropharyngeal exudate, posterior oropharyngeal erythema or uvula swelling.     Tonsils: No tonsillar abscesses.  Eyes:     Conjunctiva/sclera: Conjunctivae normal.  Cardiovascular:     Rate and Rhythm: Normal rate and regular rhythm.  Pulmonary:     Effort: Pulmonary effort is normal. No  respiratory distress.     Breath sounds: No stridor. No wheezing.  Chest:     Chest wall: No tenderness.  Abdominal:  General: There is no distension.     Palpations: Abdomen is soft.     Tenderness: There is no abdominal tenderness. There is no guarding.  Musculoskeletal:        General: Normal range of motion.     Cervical back: Normal range of motion.  Skin:    General: Skin is warm and dry.     Findings: No rash.  Neurological:     Mental Status: He is alert and oriented to person, place, and time.  Psychiatric:        Behavior: Behavior normal.        Thought Content: Thought content normal.        Judgment: Judgment normal.     UC Treatments / Results  Labs (all labs ordered are listed, but only abnormal results are displayed) Labs Reviewed - No data to display  EKG   Radiology No results found.  Procedures Procedures (including critical care time)  Medications Ordered in UC Medications  ondansetron (ZOFRAN-ODT) disintegrating tablet 4 mg (4 mg Oral Given 04/07/21 1229)    Initial Impression / Assessment and Plan / UC Course  I have reviewed the triage vital signs and the nursing notes.  Pertinent labs & imaging results that were available during my care of the patient were reviewed by me and considered in my medical decision making (see chart for details).     70 year old male with COVID.  Having fevers, body aches, chills and nausea/vomiting.  He is given Zofran, started on Paxil Oved.  He is fully vaccinated due to age we will treat with Paxil.  He understands if any worsening symptoms such as increasing nausea vomiting, fevers or shortness of breath or chest pain he is to go to the ER. Final Clinical Impressions(s) / UC Diagnoses   Final diagnoses:  COVID-19  Non-intractable vomiting with nausea, unspecified vomiting type     Discharge Instructions      Please make sure you are drinking lots of fluids.  Take Tylenol 6 hours for chills, body  aches and fevers.  Use Zofran as needed for nausea.  If you develop any worsening symptoms, vomiting unable to keep things down please feel free to ER for further evaluation.   ED Prescriptions     Medication Sig Dispense Auth. Provider   ondansetron (ZOFRAN ODT) 4 MG disintegrating tablet Take 1 tablet (4 mg total) by mouth every 8 (eight) hours as needed for nausea or vomiting. 20 tablet Duanne Guess, PA-C   nirmatrelvir/ritonavir EUA (PAXLOVID) 20 x 150 MG & 10 x 100MG  TABS Take 3 tablets by mouth 2 (two) times daily for 5 days. Patient GFR is 70. Take nirmatrelvir (150 mg) two tablets twice daily for 5 days and ritonavir (100 mg) one tablet twice daily for 5 days. 30 tablet Duanne Guess, PA-C      PDMP not reviewed this encounter.   Duanne Guess, [place] 04/07/21 1258

## 2021-04-29 ENCOUNTER — Other Ambulatory Visit: Payer: Self-pay | Admitting: Family Medicine

## 2021-04-29 DIAGNOSIS — I10 Essential (primary) hypertension: Secondary | ICD-10-CM

## 2021-05-13 ENCOUNTER — Other Ambulatory Visit: Payer: Self-pay

## 2021-05-13 ENCOUNTER — Ambulatory Visit (INDEPENDENT_AMBULATORY_CARE_PROVIDER_SITE_OTHER): Payer: Medicare HMO | Admitting: Family Medicine

## 2021-05-13 VITALS — BP 139/82 | HR 56 | Temp 98.3°F | Wt 229.0 lb

## 2021-05-13 DIAGNOSIS — Z23 Encounter for immunization: Secondary | ICD-10-CM | POA: Diagnosis not present

## 2021-05-13 DIAGNOSIS — R69 Illness, unspecified: Secondary | ICD-10-CM | POA: Diagnosis not present

## 2021-05-13 DIAGNOSIS — C4491 Basal cell carcinoma of skin, unspecified: Secondary | ICD-10-CM | POA: Diagnosis not present

## 2021-05-13 DIAGNOSIS — F411 Generalized anxiety disorder: Secondary | ICD-10-CM

## 2021-05-13 DIAGNOSIS — G4733 Obstructive sleep apnea (adult) (pediatric): Secondary | ICD-10-CM | POA: Diagnosis not present

## 2021-05-13 DIAGNOSIS — I10 Essential (primary) hypertension: Secondary | ICD-10-CM | POA: Diagnosis not present

## 2021-05-13 DIAGNOSIS — J309 Allergic rhinitis, unspecified: Secondary | ICD-10-CM

## 2021-05-13 DIAGNOSIS — K219 Gastro-esophageal reflux disease without esophagitis: Secondary | ICD-10-CM | POA: Diagnosis not present

## 2021-05-13 DIAGNOSIS — F32A Depression, unspecified: Secondary | ICD-10-CM

## 2021-05-13 NOTE — Progress Notes (Signed)
Established patient visit   Patient: Steven Russell   DOB: [birth year]   70 y.o. Male  MRN: [id number] Visit Date: 05/13/2021  Today's healthcare provider: Wilhemena Durie, MD   No chief complaint on file.  Subjective    HPI  Patient comes in today for follow-up.  He is doing well and is tolerating losartan well for his blood pressure.  It has been under good control at home.  He feels well on sertraline 50. CPAP tells him that he needs a new machine.  Had this for about 10 years. Hypertension, follow-up  BP Readings from Last 3 Encounters:  05/13/21 139/82  04/07/21 (!) 149/87  02/07/21 137/80   Wt Readings from Last 3 Encounters:  05/13/21 229 lb (103.9 kg)  02/07/21 229 lb (103.9 kg)  12/04/20 225 lb (102.1 kg)     He was last seen for hypertension 3 months ago.  BP at that visit was as abov. Management since that visit includes starting Losartan.  He reports good compliance with treatment. He is not having side effects.  He is following a Regular diet.  Use of agents associated with hypertension: none.   Outside blood pressures are 120-140's over 70-80's. Symptoms: No chest pain No chest pressure  No palpitations No syncope  No dyspnea No orthopnea  No paroxysmal nocturnal dyspnea No lower extremity edema   Pertinent labs: Lab Results  Component Value Date   CHOL 129 12/04/2020   HDL 33 (L) 12/04/2020   LDLCALC 75 12/04/2020   TRIG 113 12/04/2020   CHOLHDL 3.9 12/04/2020   Lab Results  Component Value Date   NA 141 12/04/2020   K 3.6 12/04/2020   CREATININE 1.12 12/04/2020   EGFR 72 12/04/2020   GLUCOSE 119 (H) 12/04/2020   TSH 2.660 12/04/2020     The ASCVD Risk score (Arnett DK, et al., 2019) failed to calculate for the following reasons:   The valid total cholesterol range is 130 to 320 mg/dL   --------------------------------------------------------------------------------------------------- Sleep apnea-patient states he has been  using his CPAP with good results for 10-12 years now.  He reports needing to have a new machine.  Patient has been informed that he may need another sleep study before the insurance will cover a new machine.    Medications: Outpatient Medications Prior to Visit  Medication Sig   aspirin EC 81 MG tablet Take 81 mg by mouth daily. Swallow whole.   atorvastatin (LIPITOR) 40 MG tablet Take 1 tablet (40 mg total) by mouth daily.   esomeprazole (NEXIUM) 20 MG capsule Take 40 mg by mouth daily at 12 noon.    losartan (COZAAR) 50 MG tablet TAKE 1 TABLET BY MOUTH EVERY DAY   sertraline (ZOLOFT) 50 MG tablet TAKE 1 AND 1/2 TABLETS BY MOUTH DAILY   [DISCONTINUED] chlorthalidone (HYGROTON) 25 MG tablet TAKE 1 TABLET (25 MG TOTAL) BY MOUTH DAILY. (Patient not taking: Reported on 02/07/2021)   [DISCONTINUED] ondansetron (ZOFRAN ODT) 4 MG disintegrating tablet Take 1 tablet (4 mg total) by mouth every 8 (eight) hours as needed for nausea or vomiting.   No facility-administered medications prior to visit.    Review of Systems      Objective    BP 139/82 (BP Location: Right Arm, Patient Position: Sitting, Cuff Size: Large)   Pulse (!) 56   Temp 98.3 F (36.8 C) (Oral)   Wt 229 lb (103.9 kg)   SpO2 97%   BMI 31.06 kg/m  BP  Readings from Last 3 Encounters:  05/13/21 139/82  04/07/21 (!) 149/87  02/07/21 137/80   Wt Readings from Last 3 Encounters:  05/13/21 229 lb (103.9 kg)  02/07/21 229 lb (103.9 kg)  12/04/20 225 lb (102.1 kg)      Physical Exam Vitals reviewed.  Constitutional:      Appearance: He is well-developed.  HENT:     Head: Normocephalic and atraumatic.  Eyes:     General: No scleral icterus.    Conjunctiva/sclera: Conjunctivae normal.  Cardiovascular:     Rate and Rhythm: Normal rate and regular rhythm.     Comments: No carotid bruit Pulmonary:     Effort: Pulmonary effort is normal.     Breath sounds: Normal breath sounds.  Abdominal:     Palpations: Abdomen is  soft.  Musculoskeletal:     Right lower leg: No edema.     Left lower leg: No edema.  Skin:    General: Skin is warm and dry.  Neurological:     General: No focal deficit present.     Mental Status: He is oriented to person, place, and time.  Psychiatric:        Mood and Affect: Mood normal.        Behavior: Behavior normal.        Thought Content: Thought content normal.        Judgment: Judgment normal.      No results found for any visits on 05/13/21.  Assessment & Plan     1. Primary hypertension Good control on losartan 50 and chlorthalidone 25 Lipids controlled on atorvastatin 40  2. Need for influenza vaccination  - Flu Vaccine QUAD High Dose(Fluad)  3. OSA (obstructive sleep apnea) 's new CPAP machine.  We will start that process.  4. Anxiety, generalized Well-controlled on sertraline.  5. Depression, unspecified depression type Well-controlled and patient wishes to stay on sertraline indefinitely  6. Allergic rhinitis, unspecified seasonality, unspecified trigger   7. Gastroesophageal reflux disease, unspecified whether esophagitis present Needs Nexium daily  8. H/o Basal cell carcinoma (BCC), unspecified site Followed yearly by dermatology   No follow-ups on file.      I, Wilhemena Durie, MD, have reviewed all documentation for this visit. The documentation on 05/13/21 for the exam, diagnosis, procedures, and orders are all accurate and complete.    Sal Spratley Cranford Mon, MD  [facility] [phone] (phone) [phone] (fax)  [place]

## 2021-09-09 DIAGNOSIS — D2271 Melanocytic nevi of right lower limb, including hip: Secondary | ICD-10-CM | POA: Diagnosis not present

## 2021-09-09 DIAGNOSIS — D0461 Carcinoma in situ of skin of right upper limb, including shoulder: Secondary | ICD-10-CM | POA: Diagnosis not present

## 2021-09-09 DIAGNOSIS — D2261 Melanocytic nevi of right upper limb, including shoulder: Secondary | ICD-10-CM | POA: Diagnosis not present

## 2021-09-09 DIAGNOSIS — D2262 Melanocytic nevi of left upper limb, including shoulder: Secondary | ICD-10-CM | POA: Diagnosis not present

## 2021-09-09 DIAGNOSIS — L570 Actinic keratosis: Secondary | ICD-10-CM | POA: Diagnosis not present

## 2021-09-09 DIAGNOSIS — D485 Neoplasm of uncertain behavior of skin: Secondary | ICD-10-CM | POA: Diagnosis not present

## 2021-09-09 DIAGNOSIS — Z85828 Personal history of other malignant neoplasm of skin: Secondary | ICD-10-CM | POA: Diagnosis not present

## 2021-09-09 DIAGNOSIS — X32XXXA Exposure to sunlight, initial encounter: Secondary | ICD-10-CM | POA: Diagnosis not present

## 2021-09-23 DIAGNOSIS — D0461 Carcinoma in situ of skin of right upper limb, including shoulder: Secondary | ICD-10-CM | POA: Diagnosis not present

## 2021-12-05 ENCOUNTER — Ambulatory Visit (INDEPENDENT_AMBULATORY_CARE_PROVIDER_SITE_OTHER): Payer: Medicare HMO

## 2021-12-05 VITALS — Wt 229.0 lb

## 2021-12-05 DIAGNOSIS — Z0000 Encounter for general adult medical examination without abnormal findings: Secondary | ICD-10-CM | POA: Diagnosis not present

## 2021-12-05 NOTE — Progress Notes (Signed)
Virtual Visit via Telephone Note  I connected with  Steven Russell on 12/05/21 at 10:45 AM EDT by telephone and verified that I am speaking with the correct person using two identifiers.  Location: Patient: home Provider: [facility] Persons participating in the virtual visit: [place]   I discussed the limitations, risks, security and privacy concerns of performing an evaluation and management service by telephone and the availability of in person appointments. The patient expressed understanding and agreed to proceed.  Interactive audio and video telecommunications were attempted between this nurse and patient, however failed, due to patient having technical difficulties OR patient did not have access to video capability.  We continued and completed visit with audio only.  Some vital signs may be absent or patient reported.   Dionisio David, LPN  Subjective:   Steven Russell is a 71 y.o. male who presents for Medicare Annual/Subsequent preventive examination.  Review of Systems           Objective:    There were no vitals filed for this visit. There is no height or weight on file to calculate BMI.     04/23/2020   10:15 AM 10/25/2019    9:04 AM 07/12/2015    2:40 PM  Advanced Directives  Does Patient Have a Medical Advance Directive? Yes Yes Yes  Type of Paramedic of [place];Living will [place];Living will   Copy of [place] in Chart? No - copy requested No - copy requested     Current Medications (verified) Outpatient Encounter Medications as of 12/05/2021  Medication Sig   aspirin EC 81 MG tablet Take 81 mg by mouth daily. Swallow whole.   atorvastatin (LIPITOR) 40 MG tablet Take 1 tablet (40 mg total) by mouth daily.   esomeprazole (NEXIUM) 20 MG capsule Take 40 mg by mouth daily at 12 noon.    losartan (COZAAR) 50 MG tablet TAKE 1 TABLET BY MOUTH EVERY DAY   sertraline (ZOLOFT) 50 MG  tablet TAKE 1 AND 1/2 TABLETS BY MOUTH DAILY   No facility-administered encounter medications on file as of 12/05/2021.    Allergies (verified) Patient has no known allergies.   History: Past Medical History:  Diagnosis Date   Anxiety    Depression    GERD (gastroesophageal reflux disease)    Hypertension    Pre-diabetes    Sleep apnea    Past Surgical History:  Procedure Laterality Date   COLONOSCOPY WITH PROPOFOL N/A 07/12/2015   Procedure: COLONOSCOPY WITH PROPOFOL;  Surgeon: Manya Silvas, MD;  Location: [facility] ENDOSCOPY;  Service: Endoscopy;  Laterality: N/A;   HERNIA REPAIR     LUMBAR LAMINECTOMY/DECOMPRESSION MICRODISCECTOMY Left 04/25/2020   Procedure: Left Lumbar five- Sacral one Disectomy;  Surgeon: Melina Schools, MD;  Location: [facility];  Service: Orthopedics;  Laterality: Left;  2 hrs   TONSILLECTOMY     UPPER GI ENDOSCOPY  10/05/06   normal duodenum, reflux esophagitis and hiatus hernia   VASECTOMY     Family History  Problem Relation Russell of Onset   Breast cancer Mother    Glaucoma Mother    Lung cancer Father    Diabetes Sister    Diabetes Daughter    Melanoma Sister    Brain cancer Paternal Grandfather    Social History   Socioeconomic History   Marital status: Married    Spouse name: Not on file   Number of children: 2   Years of education:  Not on file   Highest education level: Bachelor's degree (e.g., BA, AB, BS)  Occupational History   Occupation: retired  Tobacco Use   Smoking status: Former   Smokeless tobacco: Never   Tobacco comments:    quit 35 years ago  Vaping Use   Vaping Use: Never used  Substance and Sexual Activity   Alcohol use: Yes    Alcohol/week: 1.0 standard drink    Types: 1 Cans of beer per week    Comment: wine or beer a glass EOD   Drug use: No   Sexual activity: Not on file  Other Topics Concern   Not on file  Social History Narrative   Not on file   Social Determinants of Health   Financial Resource Strain:  Not on file  Food Insecurity: Not on file  Transportation Needs: Not on file  Physical Activity: Not on file  Stress: Not on file  Social Connections: Not on file    Tobacco Counseling Counseling given: Not Answered Tobacco comments: quit 35 years ago   Clinical Intake:  Pre-visit preparation completed: No  Pain : No/denies pain     Nutritional Risks: None Diabetes: No  How often do you need to have someone help you when you read instructions, pamphlets, or other written materials from your doctor or pharmacy?: 1 - Never  Diabetic?no  Interpreter Needed?: No  Information entered by :: Kirke Shaggy, LPN   Activities of Daily Living    05/13/2021    8:51 AM  In your present state of health, do you have any difficulty performing the following activities:  Hearing? 0  Vision? 0  Difficulty concentrating or making decisions? 0  Walking or climbing stairs? 0  Dressing or bathing? 0  Doing errands, shopping? 0    Patient Care Team: Jerrol Banana., MD as PCP - General (Family Medicine) Kate Sable, MD as PCP - Cardiology (Cardiology) Dasher, Rayvon Char, MD (Dermatology)  Indicate any recent Medical Services you may have received from other than [facility] providers in the past year (date may be approximate).     Assessment:   This is a routine wellness examination for Steven Russell.  Hearing/Vision screen No results found.  Dietary issues and exercise activities discussed:     Goals Addressed   None    Depression Screen    05/13/2021    8:51 AM 12/04/2020    9:25 AM 04/12/2020    8:18 AM 10/25/2019    9:02 AM 08/19/2018   10:22 AM 07/23/2017    9:48 AM 07/23/2017    9:40 AM  PHQ 2/9 Scores  PHQ - 2 Score 0 1 1 0 1 0 0  PHQ- 9 Score '1 4 4  6 2     '$ Fall Risk    05/13/2021    8:51 AM 12/04/2020    9:25 AM 04/12/2020    8:17 AM 10/25/2019    9:05 AM 08/19/2018   10:22 AM  Fall Risk   Falls in the past year? 0 0 0 0 0  Number falls in past yr: 0 0 0 0    Injury with Fall? 0 0 0 0   Follow up  Falls evaluation completed Falls evaluation completed      FALL RISK PREVENTION PERTAINING TO THE HOME:  Any stairs in or around the home? Yes  If so, are there any without handrails? No  Home free of loose throw rugs in walkways, pet beds, electrical cords, etc? Yes  Adequate lighting in your home to reduce risk of falls? Yes   ASSISTIVE DEVICES UTILIZED TO PREVENT FALLS:  Life alert? No  Use of a cane, walker or w/c? No  Grab bars in the bathroom? No  Shower chair or bench in shower? Yes  Elevated toilet seat or a handicapped toilet? No     Cognitive Function: 0points 6CIT        10/25/2019    9:08 AM  6CIT Screen  What Year? 0 points  What month? 0 points  What time? 0 points  Count back from 20 0 points  Months in reverse 0 points  Repeat phrase 0 points  Total Score 0 points    Immunizations Immunization History  Administered Date(s) Administered   Fluad Quad(high Dose 65+) 04/12/2020, 05/13/2021   Hepatitis A, Adult 07/04/2016   Influenza, High Dose Seasonal PF 07/04/2019   Influenza,inj,Quad PF,6+ Mos 06/20/2015, 06/23/2016   Influenza-Unspecified 06/13/2018   PFIZER Comirnaty(Gray Top)Covid-19 Tri-Sucrose Vaccine 08/25/2019, 09/15/2019   PFIZER(Purple Top)SARS-COV-2 Vaccination 08/25/2019, 09/15/2019, 06/13/2020   Pneumococcal Conjugate-13 07/23/2017   Pneumococcal Polysaccharide-23 08/19/2018   Td 03/25/2004   Tdap 03/13/2014   Zoster Recombinat (Shingrix) 02/17/2019, 06/08/2020   Zoster, Live 12/07/2012    TDAP status: Up to date  Flu Vaccine status: Up to date  Pneumococcal vaccine status: Up to date  Covid-19 vaccine status: Completed vaccines  Qualifies for Shingles Vaccine? Yes   Zostavax completed Yes   Shingrix Completed?: Yes  Screening Tests Health Maintenance  Topic Date Due   COVID-19 Vaccine (6 - Booster) 08/08/2020   INFLUENZA VACCINE  02/11/2022   TETANUS/TDAP  03/13/2024    COLONOSCOPY (Pts 45-83yr Insurance coverage will need to be confirmed)  07/11/2025   Pneumonia Vaccine 70 Years old  Completed   Hepatitis C Screening  Completed   Zoster Vaccines- Shingrix  Completed   HPV VACCINES  Aged Out    Health Maintenance  Health Maintenance Due  Topic Date Due   COVID-19 Vaccine (6 - Booster) 08/08/2020    Colorectal cancer screening: Type of screening: Colonoscopy. Completed 07/12/15. Repeat every 10 years  Lung Cancer Screening: (Low Dose CT Chest recommended if Russell 70-80years, 30 pack-year currently smoking OR have quit w/in 15years.) does not qualify.   Additional Screening:  Hepatitis C Screening: does qualify; Completed 06/27/12  Vision Screening: Recommended annual ophthalmology exams for early detection of glaucoma and other disorders of the eye. Is the patient up to date with their annual eye exam?  Yes  Who is the provider or what is the name of the office in which the patient attends annual [facility] exams[facility] [place] is not established with a provider, would they like to be referred to a provider to establish care? No .   Dental Screening: Recommended annual dental exams for proper oral hygiene  Community Resource Referral / Chronic Care Management: CRR required this visit?  No   CCM required this visit?  No      Plan:     I have personally reviewed and noted the following in the patient's chart:   Medical and social history Use of alcohol, tobacco or illicit drugs  Current medications and supplements including opioid prescriptions. Patient is not currently taking opioid prescriptions. Functional ability and status Nutritional status Physical activity Advanced directives List of other physicians Hospitalizations, surgeries, and ER visits in previous 12 months Vitals Screenings to include cognitive, depression, and falls Referrals and appointments  In addition, I  have reviewed and discussed with patient  certain preventive protocols, quality metrics, and best practice recommendations. A written personalized care plan for preventive services as well as general preventive health recommendations were provided to patient.     Dionisio David, LPN   08/03/6242   Nurse Notes: none

## 2021-12-05 NOTE — Patient Instructions (Signed)
Steven Russell , Thank you for taking time to come for your Medicare Wellness Visit. I appreciate your ongoing commitment to your health goals. Please review the following plan we discussed and let me know if I can assist you in the future.   Screening recommendations/referrals: Colonoscopy: 07/12/15 Recommended yearly ophthalmology/optometry visit for glaucoma screening and checkup Recommended yearly dental visit for hygiene and checkup  Vaccinations: Influenza vaccine: 05/13/21 Pneumococcal vaccine: 08/19/18 Tdap vaccine: 03/13/14 Shingles vaccine: Zostavax 12/07/12   Shingrix 02/17/19, 06/08/20   Covid-19: 08/25/19, 09/15/19, 06/13/20  Advanced directives: no  Conditions/risks identified: none  Next appointment: Follow up in one year for your annual wellness visit. 11/13/22 @ 10:45 am by phone  Preventive Care 65 Years and Older, Male Preventive care refers to lifestyle choices and visits with your health care provider that can promote health and wellness. What does preventive care include? A yearly physical exam. This is also called an annual well check. Dental exams once or twice a year. Routine eye exams. Ask your health care provider how often you should have your eyes checked. Personal lifestyle choices, including: Daily care of your teeth and gums. Regular physical activity. Eating a healthy diet. Avoiding tobacco and drug use. Limiting alcohol use. Practicing safe sex. Taking low doses of aspirin every day. Taking vitamin and mineral supplements as recommended by your health care provider. What happens during an annual well check? The services and screenings done by your health care provider during your annual well check will depend on your age, overall health, lifestyle risk factors, and family history of disease. Counseling  Your health care provider may ask you questions about your: Alcohol use. Tobacco use. Drug use. Emotional well-being. Home and relationship  well-being. Sexual activity. Eating habits. History of falls. Memory and ability to understand (cognition). Work and work Statistician. Screening  You may have the following tests or measurements: Height, weight, and BMI. Blood pressure. Lipid and cholesterol levels. These may be checked every 5 years, or more frequently if you are over 96 years old. Skin check. Lung cancer screening. You may have this screening every year starting at age 52 if you have a 30-pack-year history of smoking and currently smoke or have quit within the past 15 years. Fecal occult blood test (FOBT) of the stool. You may have this test every year starting at age 58. Flexible sigmoidoscopy or colonoscopy. You may have a sigmoidoscopy every 5 years or a colonoscopy every 10 years starting at age 39. Prostate cancer screening. Recommendations will vary depending on your family history and other risks. Hepatitis C blood test. Hepatitis B blood test. Sexually transmitted disease (STD) testing. Diabetes screening. This is done by checking your blood sugar (glucose) after you have not eaten for a while (fasting). You may have this done every 1-3 years. Abdominal aortic aneurysm (AAA) screening. You may need this if you are a current or former smoker. Osteoporosis. You may be screened starting at age 32 if you are at high risk. Talk with your health care provider about your test results, treatment options, and if necessary, the need for more tests. Vaccines  Your health care provider may recommend certain vaccines, such as: Influenza vaccine. This is recommended every year. Tetanus, diphtheria, and acellular pertussis (Tdap, Td) vaccine. You may need a Td booster every 10 years. Zoster vaccine. You may need this after age 68. Pneumococcal 13-valent conjugate (PCV13) vaccine. One dose is recommended after age 1. Pneumococcal polysaccharide (PPSV23) vaccine. One dose is recommended after age  59. Talk to your health care  provider about which screenings and vaccines you need and how often you need them. This information is not intended to replace advice given to you by your health care provider. Make sure you discuss any questions you have with your health care provider. Document Released: 07/27/2015 Document Revised: 03/19/2016 Document Reviewed: 05/01/2015 Elsevier Interactive Patient Education  2017 [place] Prevention in the Home Falls can cause injuries. They can happen to people of all ages. There are many things you can do to make your home safe and to help prevent falls. What can I do on the outside of my home? Regularly fix the edges of walkways and driveways and fix any cracks. Remove anything that might make you trip as you walk through a door, such as a raised step or threshold. Trim any bushes or trees on the path to your home. Use bright outdoor lighting. Clear any walking paths of anything that might make someone trip, such as rocks or tools. Regularly check to see if handrails are loose or broken. Make sure that both sides of any steps have handrails. Any raised decks and porches should have guardrails on the edges. Have any leaves, snow, or ice cleared regularly. Use sand or salt on walking paths during winter. Clean up any spills in your garage right away. This includes oil or grease spills. What can I do in the bathroom? Use night lights. Install grab bars by the toilet and in the tub and shower. Do not use towel bars as grab bars. Use non-skid mats or decals in the tub or shower. If you need to sit down in the shower, use a plastic, non-slip stool. Keep the floor dry. Clean up any water that spills on the floor as soon as it happens. Remove soap buildup in the tub or shower regularly. Attach bath mats securely with double-sided non-slip rug tape. Do not have throw rugs and other things on the floor that can make you trip. What can I do in the bedroom? Use night lights. Make  sure that you have a light by your bed that is easy to reach. Do not use any sheets or blankets that are too big for your bed. They should not hang down onto the floor. Have a firm chair that has side arms. You can use this for support while you get dressed. Do not have throw rugs and other things on the floor that can make you trip. What can I do in the kitchen? Clean up any spills right away. Avoid walking on wet floors. Keep items that you use a lot in easy-to-reach places. If you need to reach something above you, use a strong step stool that has a grab bar. Keep electrical cords out of the way. Do not use floor polish or wax that makes floors slippery. If you must use wax, use non-skid floor wax. Do not have throw rugs and other things on the floor that can make you trip. What can I do with my stairs? Do not leave any items on the stairs. Make sure that there are handrails on both sides of the stairs and use them. Fix handrails that are broken or loose. Make sure that handrails are as long as the stairways. Check any carpeting to make sure that it is firmly attached to the stairs. Fix any carpet that is loose or worn. Avoid having throw rugs at the top or bottom of the stairs. If you do have  throw rugs, attach them to the floor with carpet tape. Make sure that you have a light switch at the top of the stairs and the bottom of the stairs. If you do not have them, ask someone to add them for you. What else can I do to help prevent falls? Wear shoes that: Do not have high heels. Have rubber bottoms. Are comfortable and fit you well. Are closed at the toe. Do not wear sandals. If you use a stepladder: Make sure that it is fully opened. Do not climb a closed stepladder. Make sure that both sides of the stepladder are locked into place. Ask someone to hold it for you, if possible. Clearly mark and make sure that you can see: Any grab bars or handrails. First and last steps. Where the  edge of each step is. Use tools that help you move around (mobility aids) if they are needed. These include: Canes. Walkers. Scooters. Crutches. Turn on the lights when you go into a dark area. Replace any light bulbs as soon as they burn out. Set up your furniture so you have a clear path. Avoid moving your furniture around. If any of your floors are uneven, fix them. If there are any pets around you, be aware of where they are. Review your medicines with your doctor. Some medicines can make you feel dizzy. This can increase your chance of falling. Ask your doctor what other things that you can do to help prevent falls. This information is not intended to replace advice given to you by your health care provider. Make sure you discuss any questions you have with your health care provider. Document Released: 04/26/2009 Document Revised: 12/06/2015 Document Reviewed: 08/04/2014 Elsevier Interactive Patient Education  2017 [employer].

## 2021-12-06 DIAGNOSIS — K219 Gastro-esophageal reflux disease without esophagitis: Secondary | ICD-10-CM | POA: Diagnosis not present

## 2021-12-06 DIAGNOSIS — Z85828 Personal history of other malignant neoplasm of skin: Secondary | ICD-10-CM | POA: Diagnosis not present

## 2021-12-06 DIAGNOSIS — Z7982 Long term (current) use of aspirin: Secondary | ICD-10-CM | POA: Diagnosis not present

## 2021-12-06 DIAGNOSIS — E785 Hyperlipidemia, unspecified: Secondary | ICD-10-CM | POA: Diagnosis not present

## 2021-12-06 DIAGNOSIS — R69 Illness, unspecified: Secondary | ICD-10-CM | POA: Diagnosis not present

## 2021-12-06 DIAGNOSIS — Z87891 Personal history of nicotine dependence: Secondary | ICD-10-CM | POA: Diagnosis not present

## 2021-12-06 DIAGNOSIS — Z803 Family history of malignant neoplasm of breast: Secondary | ICD-10-CM | POA: Diagnosis not present

## 2021-12-06 DIAGNOSIS — I10 Essential (primary) hypertension: Secondary | ICD-10-CM | POA: Diagnosis not present

## 2021-12-06 DIAGNOSIS — Z801 Family history of malignant neoplasm of trachea, bronchus and lung: Secondary | ICD-10-CM | POA: Diagnosis not present

## 2021-12-11 ENCOUNTER — Encounter: Payer: Medicare HMO | Admitting: Family Medicine

## 2021-12-19 ENCOUNTER — Ambulatory Visit (INDEPENDENT_AMBULATORY_CARE_PROVIDER_SITE_OTHER): Payer: Medicare HMO | Admitting: Family Medicine

## 2021-12-19 ENCOUNTER — Encounter: Payer: Self-pay | Admitting: Family Medicine

## 2021-12-19 VITALS — BP 140/77 | HR 51 | Resp 16 | Ht 72.0 in | Wt 223.0 lb

## 2021-12-19 DIAGNOSIS — F411 Generalized anxiety disorder: Secondary | ICD-10-CM

## 2021-12-19 DIAGNOSIS — E7800 Pure hypercholesterolemia, unspecified: Secondary | ICD-10-CM

## 2021-12-19 DIAGNOSIS — J309 Allergic rhinitis, unspecified: Secondary | ICD-10-CM | POA: Diagnosis not present

## 2021-12-19 DIAGNOSIS — Z0000 Encounter for general adult medical examination without abnormal findings: Secondary | ICD-10-CM | POA: Diagnosis not present

## 2021-12-19 DIAGNOSIS — D649 Anemia, unspecified: Secondary | ICD-10-CM | POA: Diagnosis not present

## 2021-12-19 DIAGNOSIS — Z125 Encounter for screening for malignant neoplasm of prostate: Secondary | ICD-10-CM

## 2021-12-19 DIAGNOSIS — I10 Essential (primary) hypertension: Secondary | ICD-10-CM

## 2021-12-19 DIAGNOSIS — K219 Gastro-esophageal reflux disease without esophagitis: Secondary | ICD-10-CM

## 2021-12-19 DIAGNOSIS — G4733 Obstructive sleep apnea (adult) (pediatric): Secondary | ICD-10-CM

## 2021-12-19 DIAGNOSIS — R739 Hyperglycemia, unspecified: Secondary | ICD-10-CM

## 2021-12-19 DIAGNOSIS — R69 Illness, unspecified: Secondary | ICD-10-CM | POA: Diagnosis not present

## 2021-12-19 MED ORDER — LOSARTAN POTASSIUM 50 MG PO TABS: 50.0000 mg | ORAL_TABLET | Freq: Every day | ORAL | 3 refills | Status: AC

## 2021-12-19 MED ORDER — SERTRALINE HCL 50 MG PO TABS: ORAL_TABLET | ORAL | 3 refills | Status: AC

## 2021-12-19 MED ORDER — ATORVASTATIN CALCIUM 40 MG PO TABS: 40.0000 mg | ORAL_TABLET | Freq: Every day | ORAL | 3 refills | Status: AC

## 2021-12-19 NOTE — Progress Notes (Signed)
Complete physical exam  I,April Miller,acting as a scribe for Wilhemena Durie, MD.,have documented all relevant documentation on the behalf of Wilhemena Durie, MD,as directed by  Wilhemena Durie, MD while in the presence of Wilhemena Durie, MD.   Patient: Steven Russell   DOB: 1951-04-10   71 y.o. Male  MRN: [id number] Visit Date: 12/19/2021  Today's healthcare provider: Wilhemena Durie, MD   Chief Complaint  Patient presents with   Annual Exam   Subjective    Steven Russell is a 71 y.o. male who presents today for a complete physical exam.  He reports consuming a general diet. Home exercise routine includes walking. He generally feels well. He reports sleeping fairly well. He does not have additional problems to discuss today.  HPI  Patient had AWV with [facility] on 12/05/2021. He needs a new CPAP machine for his chronic sleep apnea. Past Medical History:  Diagnosis Date   Anxiety    Depression    GERD (gastroesophageal reflux disease)    Hypertension    Pre-diabetes    Sleep apnea    Past Surgical History:  Procedure Laterality Date   COLONOSCOPY WITH PROPOFOL N/A 07/12/2015   Procedure: COLONOSCOPY WITH PROPOFOL;  Surgeon: Manya Silvas, MD;  Location: [facility] ENDOSCOPY;  Service: Endoscopy;  Laterality: N/A;   HERNIA REPAIR     LUMBAR LAMINECTOMY/DECOMPRESSION MICRODISCECTOMY Left 04/25/2020   Procedure: Left Lumbar five- Sacral one Disectomy;  Surgeon: Melina Schools, MD;  Location: [facility];  Service: Orthopedics;  Laterality: Left;  2 hrs   TONSILLECTOMY     UPPER GI ENDOSCOPY  10/05/06   normal duodenum, reflux esophagitis and hiatus hernia   VASECTOMY     Social History   Socioeconomic History   Marital status: Married    Spouse name: Not on file   Number of children: 2   Years of education: Not on file   Highest education level: Bachelor's degree (e.g., BA, AB, BS)  Occupational History   Occupation: retired  Tobacco Use   Smoking status:  Former   Smokeless tobacco: Never   Tobacco comments:    quit 35 years ago  Vaping Use   Vaping Use: Never used  Substance and Sexual Activity   Alcohol use: Yes    Alcohol/week: 1.0 standard drink of alcohol    Types: 1 Cans of beer per week    Comment: wine or beer a glass EOD   Drug use: No   Sexual activity: Not on file  Other Topics Concern   Not on file  Social History Narrative   Not on file   Social Determinants of Health   Financial Resource Strain: Low Risk  (12/05/2021)   Overall Financial Resource Strain (CARDIA)    Difficulty of Paying Living Expenses: Not hard at all  Food Insecurity: No Food Insecurity (12/05/2021)   Hunger Vital Sign    Worried About Running Out of Food in the Last Year: Never true    Ran Out of Food in the Last Year: Never true  Transportation Needs: No Transportation Needs (12/05/2021)   PRAPARE - Hydrologist (Medical): No    Lack of Transportation (Non-Medical): No  Physical Activity: Insufficiently Active (12/05/2021)   Exercise Vital Sign    Days of Exercise per Week: 3 days    Minutes of Exercise per Session: 30 min  Stress: No Stress Concern Present (12/05/2021)   [facility] -  Occupational Stress Questionnaire    Feeling of Stress : Not at all  Social Connections: Moderately Integrated (12/05/2021)   Social Connection and Isolation Panel [NHANES]    Frequency of Communication with Friends and Family: More than three times a week    Frequency of Social Gatherings with Friends and Family: Twice a week    Attends Religious Services: More than 4 times per year    Active Member of [employer] or Organizations: No    Attends Archivist Meetings: Never    Marital Status: Married  Human resources officer Violence: Not At Risk (12/05/2021)   Humiliation, Afraid, Rape, and Kick questionnaire    Fear of Current or Ex-Partner: No    Emotionally Abused: No    Physically Abused: No     Sexually Abused: No   Family Status  Relation Name Status   Mother  Deceased at age 22   Father  Deceased at age 62   Sister  Omer   Daughter  Alive   Daughter  Alive   Sister  Alive   PGF  (Not Specified)   Family History  Problem Relation Age of Onset   Breast cancer Mother    Glaucoma Mother    Lung cancer Father    Diabetes Sister    Diabetes Daughter    Melanoma Sister    Brain cancer Paternal Grandfather    No Known Allergies  Patient Care Team: Jerrol Banana., MD as PCP - General (Family Medicine) Kate Sable, MD as PCP - Cardiology (Cardiology) Dasher, Rayvon Char, MD (Dermatology)   Medications: Outpatient Medications Prior to Visit  Medication Sig   aspirin EC 81 MG tablet Take 81 mg by mouth daily. Swallow whole.   atorvastatin (LIPITOR) 40 MG tablet Take 1 tablet (40 mg total) by mouth daily.   esomeprazole (NEXIUM) 20 MG capsule Take 40 mg by mouth daily at 12 noon.    losartan (COZAAR) 50 MG tablet TAKE 1 TABLET BY MOUTH EVERY DAY   sertraline (ZOLOFT) 50 MG tablet TAKE 1 AND 1/2 TABLETS BY MOUTH DAILY   No facility-administered medications prior to visit.    Review of Systems  Hematological:  Bruises/bleeds easily.  All other systems reviewed and are negative.   Last lipids Lab Results  Component Value Date   CHOL 135 12/20/2021   HDL 37 (L) 12/20/2021   LDLCALC 81 12/20/2021   TRIG 91 12/20/2021   CHOLHDL 3.6 12/20/2021      Objective     BP 140/77 (BP Location: Left Arm, Patient Position: Sitting, Cuff Size: Large)   Pulse (!) 51   Resp 16   Ht 6' (1.829 m)   Wt 223 lb (101.2 kg)   SpO2 98%   BMI 30.24 kg/m  BP Readings from Last 3 Encounters:  12/19/21 140/77  05/13/21 139/82  04/07/21 (!) 149/87   Wt Readings from Last 3 Encounters:  12/19/21 223 lb (101.2 kg)  12/05/21 229 lb (103.9 kg)  05/13/21 229 lb (103.9 kg)       Physical Exam Vitals reviewed.  Constitutional:      Appearance:  Normal appearance. He is normal weight.  HENT:     Head: Normocephalic and atraumatic.     Right Ear: Tympanic membrane, ear canal and external ear normal.     Left Ear: Tympanic membrane, ear canal and external ear normal.     Nose: Nose normal.     Mouth/Throat:  Mouth: Mucous membranes are moist.     Pharynx: Oropharynx is clear.  Eyes:     Extraocular Movements: Extraocular movements intact.     Conjunctiva/sclera: Conjunctivae normal.     Pupils: Pupils are equal, round, and reactive to light.  Cardiovascular:     Rate and Rhythm: Normal rate and regular rhythm.     Pulses: Normal pulses.     Heart sounds: Murmur heard.     Comments: 1/6 to 2/6 soft systolic murmur over right upper sternal border Pulmonary:     Effort: Pulmonary effort is normal.     Breath sounds: Normal breath sounds.  Abdominal:     General: Abdomen is flat. Bowel sounds are normal.     Palpations: Abdomen is soft.  Genitourinary:    Penis: Normal.      Testes: Normal.     Prostate: Normal.     Rectum: Normal.  Musculoskeletal:        General: Normal range of motion.     Cervical back: Normal range of motion and neck supple.  Skin:    General: Skin is warm and dry.  Neurological:     General: No focal deficit present.     Mental Status: He is alert and oriented to person, place, and time. Mental status is at baseline.  Psychiatric:        Mood and Affect: Mood normal.        Behavior: Behavior normal.        Thought Content: Thought content normal.        Judgment: Judgment normal.       Last depression screening scores    12/19/2021    2:44 PM 12/05/2021   10:45 AM 05/13/2021    8:51 AM  PHQ 2/9 Scores  PHQ - 2 Score 0 0 0  PHQ- 9 Score 3  1   Last fall risk screening    12/05/2021   10:48 AM  Fall Risk   Falls in the past year? 0  Number falls in past yr: 0  Injury with Fall? 0  Risk for fall due to : No Fall Risks  Follow up Falls evaluation completed   Last Audit-C alcohol  use screening    12/05/2021   10:44 AM  Alcohol Use Disorder Test (AUDIT)  1. How often do you have a drink containing alcohol? 3  2. How many drinks containing alcohol do you have on a typical day when you are drinking? 0  3. How often do you have six or more drinks on one occasion? 0  AUDIT-C Score 3  4. How often during the last year have you found that you were not able to stop drinking once you had started? 0  5. How often during the last year have you failed to do what was normally expected from you because of drinking? 0  6. How often during the last year have you needed a first drink in the morning to get yourself going after a heavy drinking session? 0  7. How often during the last year have you had a feeling of guilt of remorse after drinking? 0  8. How often during the last year have you been unable to remember what happened the night before because you had been drinking? 0  9. Have you or someone else been injured as a result of your drinking? 0  10. Has a relative or friend or a doctor or another health worker been concerned about your drinking  or suggested you cut down? 0  Alcohol Use Disorder Identification Test Final Score (AUDIT) 3   A score of 3 or more in women, and 4 or more in men indicates increased risk for alcohol abuse, EXCEPT if all of the points are from question 1   No results found for any visits on 12/19/21.  Assessment & Plan    Routine Health Maintenance and Physical Exam  Exercise Activities and Dietary recommendations  Goals      DIET - EAT MORE FRUITS AND VEGETABLES     DIET - INCREASE WATER INTAKE     Recommend to drink at least 6-8 8oz glasses of water per day.        Immunization History  Administered Date(s) Administered   Fluad Quad(high Dose 65+) 04/12/2020, 05/13/2021   Hepatitis A, Adult 07/04/2016   Influenza, High Dose Seasonal PF 07/04/2019   Influenza,inj,Quad PF,6+ Mos 06/20/2015, 06/23/2016   Influenza-Unspecified 06/13/2018    PFIZER Comirnaty(Gray Top)Covid-19 Tri-Sucrose Vaccine 08/25/2019, 09/15/2019   PFIZER(Purple Top)SARS-COV-2 Vaccination 08/25/2019, 09/15/2019, 06/13/2020   Pneumococcal Conjugate-13 07/23/2017   Pneumococcal Polysaccharide-23 08/19/2018   Td 03/25/2004   Tdap 03/13/2014   Zoster Recombinat (Shingrix) 02/17/2019, 06/08/2020   Zoster, Live 12/07/2012    Health Maintenance  Topic Date Due   COVID-19 Vaccine (6 - Booster for Pfizer series) 08/08/2020   INFLUENZA VACCINE  02/11/2022   TETANUS/TDAP  03/13/2024   COLONOSCOPY (Pts 45-32yr Insurance coverage will need to be confirmed)  07/11/2025   Pneumonia Vaccine 70 Years old  Completed   Hepatitis C Screening  Completed   Zoster Vaccines- Shingrix  Completed   HPV VACCINES  Aged Out    Discussed health benefits of physical activity, and encouraged him to engage in regular exercise appropriate for his age and condition.  1. Annual physical exam Up-to-date on screening - Lipid panel - TSH - CBC w/Diff/Platelet - Comprehensive Metabolic Panel (CMET) - Hemoglobin A1c  2. Hypertension, unspecified type Good blood pressure control - Lipid panel - TSH - CBC w/Diff/Platelet - Comprehensive Metabolic Panel (CMET) - Hemoglobin A1c  3. Hypercholesteremia  - Lipid panel - TSH - CBC w/Diff/Platelet - Comprehensive Metabolic Panel (CMET) - Hemoglobin A1c - atorvastatin (LIPITOR) 40 MG tablet; Take 1 tablet (40 mg total) by mouth daily.  Dispense: 90 tablet; Refill: 3  4. Anemia, unspecified type  - Lipid panel - TSH - CBC w/Diff/Platelet - Comprehensive Metabolic Panel (CMET) - Hemoglobin A1c - Iron, TIBC and Ferritin Panel  5. Hyperglycemia A1c - Lipid panel - TSH - CBC w/Diff/Platelet - Comprehensive Metabolic Panel (CMET) - Hemoglobin A1c  6. Allergic rhinitis, unspecified seasonality, unspecified trigger  - Lipid panel - TSH - CBC w/Diff/Platelet - Comprehensive Metabolic Panel (CMET) - Hemoglobin  A1c  7. OSA (obstructive sleep apnea) Trial to try auto titrating CPAP. - Lipid panel - TSH - CBC w/Diff/Platelet - Comprehensive Metabolic Panel (CMET) - Hemoglobin A1c  8. Gastroesophageal reflux disease, unspecified whether esophagitis present  - Lipid panel - TSH - CBC w/Diff/Platelet - Comprehensive Metabolic Panel (CMET) - Hemoglobin A1c  9. Primary hypertension  - losartan (COZAAR) 50 MG tablet; Take 1 tablet (50 mg total) by mouth daily.  Dispense: 90 tablet; Refill: 3  10. Anxiety, generalized  - sertraline (ZOLOFT) 50 MG tablet; TAKE 1 AND 1/2 TABLETS BY MOUTH DAILY  Dispense: 135 tablet; Refill: 3  11. Prostate cancer screening  - PSA   No follow-ups on file.     I, RWilhemena Durie MD,  have reviewed all documentation for this visit. The documentation on 12/29/21 for the exam, diagnosis, procedures, and orders are all accurate and complete.    Kellsie Grindle Cranford Mon, MD  [facility] [phone] (phone) [phone] (fax)  [place]

## 2021-12-20 DIAGNOSIS — Z125 Encounter for screening for malignant neoplasm of prostate: Secondary | ICD-10-CM | POA: Diagnosis not present

## 2021-12-20 DIAGNOSIS — K219 Gastro-esophageal reflux disease without esophagitis: Secondary | ICD-10-CM | POA: Diagnosis not present

## 2021-12-20 DIAGNOSIS — I10 Essential (primary) hypertension: Secondary | ICD-10-CM | POA: Diagnosis not present

## 2021-12-20 DIAGNOSIS — Z0000 Encounter for general adult medical examination without abnormal findings: Secondary | ICD-10-CM | POA: Diagnosis not present

## 2021-12-20 DIAGNOSIS — E7800 Pure hypercholesterolemia, unspecified: Secondary | ICD-10-CM | POA: Diagnosis not present

## 2021-12-20 DIAGNOSIS — G4733 Obstructive sleep apnea (adult) (pediatric): Secondary | ICD-10-CM | POA: Diagnosis not present

## 2021-12-20 DIAGNOSIS — R739 Hyperglycemia, unspecified: Secondary | ICD-10-CM | POA: Diagnosis not present

## 2021-12-20 DIAGNOSIS — J309 Allergic rhinitis, unspecified: Secondary | ICD-10-CM | POA: Diagnosis not present

## 2021-12-20 DIAGNOSIS — D649 Anemia, unspecified: Secondary | ICD-10-CM | POA: Diagnosis not present

## 2021-12-21 LAB — IRON,TIBC AND FERRITIN PANEL
Ferritin: 62 ng/mL (ref 30–400)
Iron Saturation: 47 % (ref 15–55)
Iron: 148 ug/dL (ref 38–169)
Total Iron Binding Capacity: 317 ug/dL (ref 250–450)
UIBC: 169 ug/dL (ref 111–343)

## 2021-12-21 LAB — CBC WITH DIFFERENTIAL/PLATELET
Basophils Absolute: 0 10*3/uL (ref 0.0–0.2)
Basos: 0 %
EOS (ABSOLUTE): 0.1 10*3/uL (ref 0.0–0.4)
Eos: 1 %
Hematocrit: 39.1 % (ref 37.5–51.0)
Hemoglobin: 13.2 g/dL (ref 13.0–17.7)
Immature Grans (Abs): 0 10*3/uL (ref 0.0–0.1)
Immature Granulocytes: 0 %
Lymphocytes Absolute: 1 10*3/uL (ref 0.7–3.1)
Lymphs: 22 %
MCH: 29.3 pg (ref 26.6–33.0)
MCHC: 33.8 g/dL (ref 31.5–35.7)
MCV: 87 fL (ref 79–97)
Monocytes Absolute: 0.5 10*3/uL (ref 0.1–0.9)
Monocytes: 10 %
Neutrophils Absolute: 3.1 10*3/uL (ref 1.4–7.0)
Neutrophils: 67 %
Platelets: 134 10*3/uL — ABNORMAL LOW (ref 150–450)
RBC: 4.5 x10E6/uL (ref 4.14–5.80)
RDW: 12.8 % (ref 11.6–15.4)
WBC: 4.6 10*3/uL (ref 3.4–10.8)

## 2021-12-21 LAB — LIPID PANEL
Chol/HDL Ratio: 3.6 ratio (ref 0.0–5.0)
Cholesterol, Total: 135 mg/dL (ref 100–199)
HDL: 37 mg/dL — ABNORMAL LOW (ref >39)
LDL Chol Calc (NIH): 81 mg/dL (ref 0–99)
Triglycerides: 91 mg/dL (ref 0–149)
VLDL Cholesterol Cal: 17 mg/dL (ref 5–40)

## 2021-12-21 LAB — COMPREHENSIVE METABOLIC PANEL
ALT: 14 IU/L (ref 0–44)
AST: 19 IU/L (ref 0–40)
Albumin/Globulin Ratio: 1.9 (ref 1.2–2.2)
Albumin: 4.6 g/dL (ref 3.8–4.8)
Alkaline Phosphatase: 112 IU/L (ref 44–121)
BUN/Creatinine Ratio: 19 (ref 10–24)
BUN: 19 mg/dL (ref 8–27)
Bilirubin Total: 0.6 mg/dL (ref 0.0–1.2)
CO2: 24 mmol/L (ref 20–29)
Calcium: 9.5 mg/dL (ref 8.6–10.2)
Chloride: 102 mmol/L (ref 96–106)
Creatinine, Ser: 0.99 mg/dL (ref 0.76–1.27)
Globulin, Total: 2.4 g/dL (ref 1.5–4.5)
Glucose: 105 mg/dL — ABNORMAL HIGH (ref 70–99)
Potassium: 4.6 mmol/L (ref 3.5–5.2)
Sodium: 139 mmol/L (ref 134–144)
Total Protein: 7 g/dL (ref 6.0–8.5)
eGFR: 82 mL/min/{1.73_m2} (ref >59)

## 2021-12-21 LAB — HEMOGLOBIN A1C
Est. average glucose Bld gHb Est-mCnc: 123 mg/dL
Hgb A1c MFr Bld: 5.9 % — ABNORMAL HIGH (ref 4.8–5.6)

## 2021-12-21 LAB — TSH: TSH: 3.3 u[IU]/mL (ref 0.450–4.500)

## 2021-12-21 LAB — PSA: Prostate Specific Ag, Serum: 1.2 ng/mL (ref 0.0–4.0)

## 2021-12-31 ENCOUNTER — Other Ambulatory Visit: Payer: Self-pay | Admitting: *Deleted

## 2021-12-31 DIAGNOSIS — G4733 Obstructive sleep apnea (adult) (pediatric): Secondary | ICD-10-CM

## 2022-01-06 ENCOUNTER — Other Ambulatory Visit: Payer: Self-pay | Admitting: *Deleted

## 2022-01-06 DIAGNOSIS — G4733 Obstructive sleep apnea (adult) (pediatric): Secondary | ICD-10-CM

## 2022-01-10 ENCOUNTER — Other Ambulatory Visit: Payer: Self-pay | Admitting: Family Medicine

## 2022-01-10 DIAGNOSIS — I10 Essential (primary) hypertension: Secondary | ICD-10-CM

## 2022-01-10 NOTE — Telephone Encounter (Signed)
Not on current med list. Requested Prescriptions  Pending Prescriptions Disp Refills  . chlorthalidone (HYGROTON) 25 MG tablet [Pharmacy Med Name: CHLORTHALIDONE 25 MG TABLET] 90 tablet 3    Sig: TAKE 1 TABLET (25 MG TOTAL) BY MOUTH DAILY.     Cardiovascular: Diuretics - Thiazide Failed - 01/10/2022  5:39 PM      Failed - Last BP in normal range    BP Readings from Last 1 Encounters:  12/19/21 140/77         Passed - Cr in normal range and within 180 days    Creatinine, Ser  Date Value Ref Range Status  12/20/2021 0.99 0.76 - 1.27 mg/dL Final         Passed - K in normal range and within 180 days    Potassium  Date Value Ref Range Status  12/20/2021 4.6 3.5 - 5.2 mmol/L Final         Passed - Na in normal range and within 180 days    Sodium  Date Value Ref Range Status  12/20/2021 139 134 - 144 mmol/L Final         Passed - Valid encounter within last 6 months    Recent Outpatient Visits          3 weeks ago Annual physical exam   [facility] Jerrol Banana., MD   8 months ago Primary hypertension   [facility] Jerrol Banana., MD   11 months ago Primary hypertension   [facility] Jerrol Banana., MD   1 year ago Medicare annual wellness visit, subsequent   [facility] Jerrol Banana., MD   1 year ago Primary hypertension   [facility] Jerrol Banana., MD      Future Appointments            In 11 months Jerrol Banana., MD [facility], [place]

## 2022-01-20 DIAGNOSIS — H524 Presbyopia: Secondary | ICD-10-CM | POA: Diagnosis not present

## 2022-02-03 DIAGNOSIS — R0683 Snoring: Secondary | ICD-10-CM | POA: Diagnosis not present

## 2022-02-03 DIAGNOSIS — G4733 Obstructive sleep apnea (adult) (pediatric): Secondary | ICD-10-CM | POA: Diagnosis not present

## 2022-03-06 DIAGNOSIS — G4733 Obstructive sleep apnea (adult) (pediatric): Secondary | ICD-10-CM | POA: Diagnosis not present

## 2022-03-06 DIAGNOSIS — R0683 Snoring: Secondary | ICD-10-CM | POA: Diagnosis not present

## 2022-03-10 DIAGNOSIS — D2272 Melanocytic nevi of left lower limb, including hip: Secondary | ICD-10-CM | POA: Diagnosis not present

## 2022-03-10 DIAGNOSIS — L570 Actinic keratosis: Secondary | ICD-10-CM | POA: Diagnosis not present

## 2022-03-10 DIAGNOSIS — L281 Prurigo nodularis: Secondary | ICD-10-CM | POA: Diagnosis not present

## 2022-03-10 DIAGNOSIS — Z85828 Personal history of other malignant neoplasm of skin: Secondary | ICD-10-CM | POA: Diagnosis not present

## 2022-03-10 DIAGNOSIS — D2261 Melanocytic nevi of right upper limb, including shoulder: Secondary | ICD-10-CM | POA: Diagnosis not present

## 2022-03-10 DIAGNOSIS — X32XXXA Exposure to sunlight, initial encounter: Secondary | ICD-10-CM | POA: Diagnosis not present

## 2022-03-10 DIAGNOSIS — D2262 Melanocytic nevi of left upper limb, including shoulder: Secondary | ICD-10-CM | POA: Diagnosis not present

## 2022-04-06 DIAGNOSIS — G4733 Obstructive sleep apnea (adult) (pediatric): Secondary | ICD-10-CM | POA: Diagnosis not present

## 2022-04-06 DIAGNOSIS — R0683 Snoring: Secondary | ICD-10-CM | POA: Diagnosis not present

## 2022-05-06 DIAGNOSIS — G4733 Obstructive sleep apnea (adult) (pediatric): Secondary | ICD-10-CM | POA: Diagnosis not present

## 2022-05-06 DIAGNOSIS — R0683 Snoring: Secondary | ICD-10-CM | POA: Diagnosis not present

## 2022-06-06 DIAGNOSIS — G4733 Obstructive sleep apnea (adult) (pediatric): Secondary | ICD-10-CM | POA: Diagnosis not present

## 2022-06-06 DIAGNOSIS — R0683 Snoring: Secondary | ICD-10-CM | POA: Diagnosis not present

## 2022-07-06 DIAGNOSIS — G4733 Obstructive sleep apnea (adult) (pediatric): Secondary | ICD-10-CM | POA: Diagnosis not present

## 2022-07-06 DIAGNOSIS — R0683 Snoring: Secondary | ICD-10-CM | POA: Diagnosis not present

## 2022-08-06 DIAGNOSIS — G4733 Obstructive sleep apnea (adult) (pediatric): Secondary | ICD-10-CM | POA: Diagnosis not present

## 2022-08-06 DIAGNOSIS — R0683 Snoring: Secondary | ICD-10-CM | POA: Diagnosis not present

## 2022-09-06 DIAGNOSIS — R0683 Snoring: Secondary | ICD-10-CM | POA: Diagnosis not present

## 2022-09-06 DIAGNOSIS — G4733 Obstructive sleep apnea (adult) (pediatric): Secondary | ICD-10-CM | POA: Diagnosis not present

## 2022-10-05 DIAGNOSIS — R0683 Snoring: Secondary | ICD-10-CM | POA: Diagnosis not present

## 2022-10-05 DIAGNOSIS — G4733 Obstructive sleep apnea (adult) (pediatric): Secondary | ICD-10-CM | POA: Diagnosis not present

## 2022-10-29 DIAGNOSIS — H524 Presbyopia: Secondary | ICD-10-CM | POA: Diagnosis not present

## 2022-11-05 DIAGNOSIS — R0683 Snoring: Secondary | ICD-10-CM | POA: Diagnosis not present

## 2022-11-05 DIAGNOSIS — G4733 Obstructive sleep apnea (adult) (pediatric): Secondary | ICD-10-CM | POA: Diagnosis not present

## 2022-11-18 DIAGNOSIS — D485 Neoplasm of uncertain behavior of skin: Secondary | ICD-10-CM | POA: Diagnosis not present

## 2022-11-18 DIAGNOSIS — D2272 Melanocytic nevi of left lower limb, including hip: Secondary | ICD-10-CM | POA: Diagnosis not present

## 2022-11-18 DIAGNOSIS — L298 Other pruritus: Secondary | ICD-10-CM | POA: Diagnosis not present

## 2022-11-18 DIAGNOSIS — R58 Hemorrhage, not elsewhere classified: Secondary | ICD-10-CM | POA: Diagnosis not present

## 2022-11-18 DIAGNOSIS — L570 Actinic keratosis: Secondary | ICD-10-CM | POA: Diagnosis not present

## 2022-11-18 DIAGNOSIS — D2262 Melanocytic nevi of left upper limb, including shoulder: Secondary | ICD-10-CM | POA: Diagnosis not present

## 2022-11-18 DIAGNOSIS — Z85828 Personal history of other malignant neoplasm of skin: Secondary | ICD-10-CM | POA: Diagnosis not present

## 2022-11-18 DIAGNOSIS — C44519 Basal cell carcinoma of skin of other part of trunk: Secondary | ICD-10-CM | POA: Diagnosis not present

## 2022-11-18 DIAGNOSIS — L820 Inflamed seborrheic keratosis: Secondary | ICD-10-CM | POA: Diagnosis not present

## 2022-11-18 DIAGNOSIS — L538 Other specified erythematous conditions: Secondary | ICD-10-CM | POA: Diagnosis not present

## 2022-11-18 DIAGNOSIS — D2261 Melanocytic nevi of right upper limb, including shoulder: Secondary | ICD-10-CM | POA: Diagnosis not present

## 2022-12-05 DIAGNOSIS — R0683 Snoring: Secondary | ICD-10-CM | POA: Diagnosis not present

## 2022-12-05 DIAGNOSIS — G4733 Obstructive sleep apnea (adult) (pediatric): Secondary | ICD-10-CM | POA: Diagnosis not present

## 2022-12-24 ENCOUNTER — Encounter: Payer: Medicare HMO | Admitting: Family Medicine

## 2022-12-25 DIAGNOSIS — Z125 Encounter for screening for malignant neoplasm of prostate: Secondary | ICD-10-CM | POA: Diagnosis not present

## 2022-12-25 DIAGNOSIS — Z0000 Encounter for general adult medical examination without abnormal findings: Secondary | ICD-10-CM | POA: Diagnosis not present

## 2022-12-25 DIAGNOSIS — I10 Essential (primary) hypertension: Secondary | ICD-10-CM | POA: Diagnosis not present

## 2022-12-25 DIAGNOSIS — R739 Hyperglycemia, unspecified: Secondary | ICD-10-CM | POA: Diagnosis not present

## 2022-12-25 DIAGNOSIS — E7800 Pure hypercholesterolemia, unspecified: Secondary | ICD-10-CM | POA: Diagnosis not present

## 2022-12-25 DIAGNOSIS — K219 Gastro-esophageal reflux disease without esophagitis: Secondary | ICD-10-CM | POA: Diagnosis not present

## 2022-12-25 DIAGNOSIS — J309 Allergic rhinitis, unspecified: Secondary | ICD-10-CM | POA: Diagnosis not present

## 2022-12-29 DIAGNOSIS — C44519 Basal cell carcinoma of skin of other part of trunk: Secondary | ICD-10-CM | POA: Diagnosis not present

## 2023-01-05 DIAGNOSIS — R0683 Snoring: Secondary | ICD-10-CM | POA: Diagnosis not present

## 2023-01-05 DIAGNOSIS — G4733 Obstructive sleep apnea (adult) (pediatric): Secondary | ICD-10-CM | POA: Diagnosis not present

## 2023-02-04 DIAGNOSIS — G4733 Obstructive sleep apnea (adult) (pediatric): Secondary | ICD-10-CM | POA: Diagnosis not present

## 2023-02-04 DIAGNOSIS — R0683 Snoring: Secondary | ICD-10-CM | POA: Diagnosis not present

## 2023-06-16 DIAGNOSIS — D225 Melanocytic nevi of trunk: Secondary | ICD-10-CM | POA: Diagnosis not present

## 2023-06-16 DIAGNOSIS — L570 Actinic keratosis: Secondary | ICD-10-CM | POA: Diagnosis not present

## 2023-06-16 DIAGNOSIS — L565 Disseminated superficial actinic porokeratosis (DSAP): Secondary | ICD-10-CM | POA: Diagnosis not present

## 2023-06-16 DIAGNOSIS — D2262 Melanocytic nevi of left upper limb, including shoulder: Secondary | ICD-10-CM | POA: Diagnosis not present

## 2023-06-16 DIAGNOSIS — Z85828 Personal history of other malignant neoplasm of skin: Secondary | ICD-10-CM | POA: Diagnosis not present

## 2023-06-16 DIAGNOSIS — D2272 Melanocytic nevi of left lower limb, including hip: Secondary | ICD-10-CM | POA: Diagnosis not present

## 2023-06-16 DIAGNOSIS — D2261 Melanocytic nevi of right upper limb, including shoulder: Secondary | ICD-10-CM | POA: Diagnosis not present

## 2023-07-06 DIAGNOSIS — R69 Illness, unspecified: Secondary | ICD-10-CM | POA: Diagnosis not present

## 2023-07-16 DIAGNOSIS — R011 Cardiac murmur, unspecified: Secondary | ICD-10-CM | POA: Diagnosis not present

## 2023-07-16 DIAGNOSIS — I10 Essential (primary) hypertension: Secondary | ICD-10-CM | POA: Diagnosis not present

## 2023-07-16 DIAGNOSIS — E7800 Pure hypercholesterolemia, unspecified: Secondary | ICD-10-CM | POA: Diagnosis not present

## 2023-07-16 DIAGNOSIS — I340 Nonrheumatic mitral (valve) insufficiency: Secondary | ICD-10-CM | POA: Diagnosis not present

## 2023-08-26 DIAGNOSIS — I340 Nonrheumatic mitral (valve) insufficiency: Secondary | ICD-10-CM | POA: Diagnosis not present

## 2023-08-26 DIAGNOSIS — R011 Cardiac murmur, unspecified: Secondary | ICD-10-CM | POA: Diagnosis not present

## 2023-09-02 DIAGNOSIS — I340 Nonrheumatic mitral (valve) insufficiency: Secondary | ICD-10-CM | POA: Diagnosis not present

## 2023-09-02 DIAGNOSIS — I351 Nonrheumatic aortic (valve) insufficiency: Secondary | ICD-10-CM | POA: Diagnosis not present

## 2023-09-02 DIAGNOSIS — I10 Essential (primary) hypertension: Secondary | ICD-10-CM | POA: Diagnosis not present

## 2023-09-02 DIAGNOSIS — E7800 Pure hypercholesterolemia, unspecified: Secondary | ICD-10-CM | POA: Diagnosis not present

## 2023-10-15 DIAGNOSIS — H524 Presbyopia: Secondary | ICD-10-CM | POA: Diagnosis not present

## 2023-11-25 DIAGNOSIS — R739 Hyperglycemia, unspecified: Secondary | ICD-10-CM | POA: Diagnosis not present

## 2023-11-25 DIAGNOSIS — G4733 Obstructive sleep apnea (adult) (pediatric): Secondary | ICD-10-CM | POA: Diagnosis not present

## 2023-11-25 DIAGNOSIS — K219 Gastro-esophageal reflux disease without esophagitis: Secondary | ICD-10-CM | POA: Diagnosis not present

## 2023-11-25 DIAGNOSIS — J188 Other pneumonia, unspecified organism: Secondary | ICD-10-CM | POA: Diagnosis not present

## 2023-11-25 DIAGNOSIS — R051 Acute cough: Secondary | ICD-10-CM | POA: Diagnosis not present

## 2023-11-25 DIAGNOSIS — D649 Anemia, unspecified: Secondary | ICD-10-CM | POA: Diagnosis not present

## 2023-11-25 DIAGNOSIS — R140 Abdominal distension (gaseous): Secondary | ICD-10-CM | POA: Diagnosis not present

## 2023-11-25 DIAGNOSIS — J189 Pneumonia, unspecified organism: Secondary | ICD-10-CM | POA: Diagnosis not present

## 2023-12-30 DIAGNOSIS — Z1331 Encounter for screening for depression: Secondary | ICD-10-CM | POA: Diagnosis not present

## 2023-12-30 DIAGNOSIS — K219 Gastro-esophageal reflux disease without esophagitis: Secondary | ICD-10-CM | POA: Diagnosis not present

## 2023-12-30 DIAGNOSIS — Z125 Encounter for screening for malignant neoplasm of prostate: Secondary | ICD-10-CM | POA: Diagnosis not present

## 2023-12-30 DIAGNOSIS — Z0000 Encounter for general adult medical examination without abnormal findings: Secondary | ICD-10-CM | POA: Diagnosis not present

## 2023-12-30 DIAGNOSIS — D649 Anemia, unspecified: Secondary | ICD-10-CM | POA: Diagnosis not present

## 2024-02-16 DIAGNOSIS — D2262 Melanocytic nevi of left upper limb, including shoulder: Secondary | ICD-10-CM | POA: Diagnosis not present

## 2024-02-16 DIAGNOSIS — D0439 Carcinoma in situ of skin of other parts of face: Secondary | ICD-10-CM | POA: Diagnosis not present

## 2024-02-16 DIAGNOSIS — D2271 Melanocytic nevi of right lower limb, including hip: Secondary | ICD-10-CM | POA: Diagnosis not present

## 2024-02-16 DIAGNOSIS — D485 Neoplasm of uncertain behavior of skin: Secondary | ICD-10-CM | POA: Diagnosis not present

## 2024-02-16 DIAGNOSIS — Z85828 Personal history of other malignant neoplasm of skin: Secondary | ICD-10-CM | POA: Diagnosis not present

## 2024-02-16 DIAGNOSIS — L570 Actinic keratosis: Secondary | ICD-10-CM | POA: Diagnosis not present

## 2024-02-16 DIAGNOSIS — D2272 Melanocytic nevi of left lower limb, including hip: Secondary | ICD-10-CM | POA: Diagnosis not present

## 2024-02-16 DIAGNOSIS — D2261 Melanocytic nevi of right upper limb, including shoulder: Secondary | ICD-10-CM | POA: Diagnosis not present

## 2024-02-21 ENCOUNTER — Ambulatory Visit
Admission: EM | Admit: 2024-02-21 | Discharge: 2024-02-21 | Disposition: A | Discharge status: Home / Self Care | Attending: Emergency Medicine | Admitting: Emergency Medicine

## 2024-02-21 DIAGNOSIS — H5789 Other specified disorders of eye and adnexa: Secondary | ICD-10-CM

## 2024-02-21 MED ORDER — POLYMYXIN B-TRIMETHOPRIM 10000-0.1 UNIT/ML-% OP SOLN
1.0000 [drp] | Freq: Four times a day (QID) | OPHTHALMIC | 0 refills | Status: AC
Start: 2024-02-21 — End: 2024-02-28

## 2024-02-21 NOTE — ED Provider Notes (Signed)
 Steven Russell    CSN: [id number] Arrival date & time: 02/21/24  0815      History   Chief Complaint Chief Complaint  Patient presents with   Eye Problem    HPI Steven Russell is a 73 y.o. male.  Patient presents with right eye redness, irritation, watering since this morning.  He has a sensation of foreign body in his eye.  No eye trauma, eye pain, purulent drainage, change in vision.  He used saline eyedrops this morning.  Patient wears glasses.  The history is provided by the patient and medical records.    Past Medical History:  Diagnosis Date   Anxiety    Depression    GERD (gastroesophageal reflux disease)    Hypertension    Pre-diabetes    Sleep apnea     Patient Active Problem List   Diagnosis Date Noted   Lumbar radiculopathy 04/09/2020   Low back pain 03/26/2020   Basal cell carcinoma of skin 06/14/2015   Clinical depression 06/14/2015   DD (diverticular disease) 06/14/2015   Anxiety, generalized 06/14/2015   Acid reflux 06/14/2015   Hypercholesteremia 06/14/2015   Cannot sleep 06/14/2015   Arthritis, degenerative 06/14/2015   Avitaminosis D 06/14/2015   Gastroesophageal reflux disease 06/14/2015   Generalized anxiety disorder 06/14/2015   Osteoarthritis 06/14/2015   Vitamin D deficiency 06/14/2015   Insomnia 06/14/2015   Depressive disorder 06/14/2015   Diverticular disease 06/14/2015    Past Surgical History:  Procedure Laterality Date   COLONOSCOPY WITH PROPOFOL  N/A 07/12/2015   Procedure: COLONOSCOPY WITH PROPOFOL ;  Surgeon: Lamar ONEIDA Holmes, MD;  Location: [facility] ENDOSCOPY;  Service: Endoscopy;  Laterality: N/A;   HERNIA REPAIR     LUMBAR LAMINECTOMY/DECOMPRESSION MICRODISCECTOMY Left 04/25/2020   Procedure: Left Lumbar five- Sacral one Disectomy;  Surgeon: Burnetta Aures, MD;  Location: [facility] OR;  Service: Orthopedics;  Laterality: Left;  2 hrs   TONSILLECTOMY     UPPER GI ENDOSCOPY  10/05/06   normal duodenum, reflux esophagitis and  hiatus hernia   VASECTOMY         Home Medications    Prior to Admission medications   Medication Sig Start Date End Date Taking? Authorizing Provider  trimethoprim -polymyxin b  (POLYTRIM ) ophthalmic solution Place 1 drop into the right eye 4 (four) times daily for 7 days. 02/21/24 02/28/24 Yes Corlis Burnard DEL, NP  aspirin EC 81 MG tablet Take 81 mg by mouth daily. Swallow whole.    [provider]  atorvastatin  (LIPITOR) 40 MG tablet Take 1 tablet (40 mg total) by mouth daily. 12/19/21   Bertrum Charlie CROME, MD  esomeprazole (NEXIUM) 20 MG capsule Take 40 mg by mouth daily at 12 noon.  06/15/13   [provider]  losartan  (COZAAR ) 50 MG tablet Take 1 tablet (50 mg total) by mouth daily. 12/19/21   Bertrum Charlie CROME, MD  sertraline  (ZOLOFT ) 50 MG tablet TAKE 1 AND 1/2 TABLETS BY MOUTH DAILY 12/19/21   Bertrum Charlie CROME, MD    Family History Family History  Problem Relation Age of Onset   Breast cancer Mother    Glaucoma Mother    Lung cancer Father    Diabetes Sister    Diabetes Daughter    Melanoma Sister    Brain cancer Paternal Grandfather     Social History Social History   Tobacco Use   Smoking status: Former   Smokeless tobacco: Never   Tobacco comments:    quit 35 years ago  Vaping Use  Vaping status: Never Used  Substance Use Topics   Alcohol use: Yes    Alcohol/week: 1.0 standard drink of alcohol    Types: 1 Cans of beer per week    Comment: wine or beer a glass EOD   Drug use: No     Allergies   Patient has no known allergies.   Review of Systems Review of Systems  Constitutional:  Negative for chills and fever.  Eyes:  Positive for discharge and redness. Negative for pain and visual disturbance.     Physical Exam Triage Vital Signs ED Triage Vitals  Encounter Vitals Group     BP 02/21/24 0829 136/80     Girls Systolic BP Percentile --      Girls Diastolic BP Percentile --      Boys Systolic BP Percentile --      Boys Diastolic BP  Percentile --      Pulse Rate 02/21/24 0829 60     Resp 02/21/24 0829 18     Temp 02/21/24 0829 97.9 F (36.6 C)     Temp src --      SpO2 02/21/24 0829 98 %     Weight --      Height --      Head Circumference --      Peak Flow --      Pain Score 02/21/24 0836 6     Pain Loc --      Pain Education --      Exclude from Growth Chart --    No data found.  Updated Vital Signs BP 136/80   Pulse 60   Temp 97.9 F (36.6 C)   Resp 18   SpO2 98%   Visual Acuity Right Eye Distance: 20/25 Left Eye Distance: 20/40 Bilateral Distance: 20/30  Right Eye Near:   Left Eye Near:    Bilateral Near:     Physical Exam Constitutional:      General: He is not in acute distress. HENT:     Mouth/Throat:     Mouth: Mucous membranes are moist.  Eyes:     General: Lids are normal. Vision grossly intact.     Extraocular Movements: Extraocular movements intact.     Conjunctiva/sclera:     Right eye: Right conjunctiva is injected.     Pupils: Pupils are equal, round, and reactive to light.     Right eye: No fluorescein uptake.     Comments: Right eye tearing.  No purulent drainage.  Cardiovascular:     Rate and Rhythm: Normal rate and regular rhythm.  Pulmonary:     Effort: Pulmonary effort is normal. No respiratory distress.  Neurological:     Mental Status: He is alert.      UC Treatments / Results  Labs (all labs ordered are listed, but only abnormal results are displayed) Labs Reviewed - No data to display  EKG   Radiology No results found.  Procedures Procedures (including critical care time)  Medications Ordered in UC Medications - No data to display  Initial Impression / Assessment and Plan / UC Course  I have reviewed the triage vital signs and the nursing notes.  Pertinent labs & imaging results that were available during my care of the patient were reviewed by me and considered in my medical decision making (see chart for details).    Right eye  irritation.  No fluorescein uptake noted on exam.  Right eye is injected and watering.  Treating with Polytrim  eyedrops  prophylactically.  Instructed patient to follow-up with his eye care provider tomorrow.  ED precautions given.  He agrees to plan of care.  Final Clinical Impressions(s) / UC Diagnoses   Final diagnoses:  Irritation of right eye     Discharge Instructions      Use the antibiotic eyedrops as prescribed.    Follow-up with your eye care provider on Monday.    Go to the emergency department if you have acute eye pain, changes in your vision, or other concerning symptoms.        ED Prescriptions     Medication Sig Dispense Auth. Provider   trimethoprim -polymyxin b  (POLYTRIM ) ophthalmic solution Place 1 drop into the right eye 4 (four) times daily for 7 days. 10 mL Corlis Burnard DEL, NP      PDMP not reviewed this encounter.   Corlis Burnard DEL, NP 02/21/24 [phone]

## 2024-02-21 NOTE — Discharge Instructions (Addendum)
Use the antibiotic eyedrops as prescribed.    Follow-up with your eye care provider on Monday.    Go to the emergency department if you have acute eye pain, changes in your vision, or other concerning symptoms.

## 2024-02-21 NOTE — ED Triage Notes (Signed)
 Patient to Urgent Care with complaints of R sided eye irritation/ watering/ pain. Reports he woke up with symptoms- foreign body sensation.  States over the last several nights his eyes have felt tired/ aching.

## 2024-06-21 ENCOUNTER — Ambulatory Visit

## 2024-06-21 DIAGNOSIS — K5730 Diverticulosis of large intestine without perforation or abscess without bleeding: Secondary | ICD-10-CM | POA: Diagnosis not present

## 2024-06-21 DIAGNOSIS — Z1211 Encounter for screening for malignant neoplasm of colon: Secondary | ICD-10-CM | POA: Diagnosis present

## 2024-06-21 DIAGNOSIS — D122 Benign neoplasm of ascending colon: Secondary | ICD-10-CM | POA: Diagnosis not present

## 2024-06-21 DIAGNOSIS — K640 First degree hemorrhoids: Secondary | ICD-10-CM | POA: Diagnosis not present

## 2024-06-21 DIAGNOSIS — K219 Gastro-esophageal reflux disease without esophagitis: Secondary | ICD-10-CM | POA: Diagnosis not present
